# Patient Record
Sex: Male | Born: 1950
Health system: Southern US, Community
[De-identification: ages and names within clinical notes are randomized; demographics above are authoritative.]

## PROBLEM LIST (undated history)

## (undated) DIAGNOSIS — H43819 Vitreous degeneration, unspecified eye: Secondary | ICD-10-CM

## (undated) DIAGNOSIS — I10 Essential (primary) hypertension: Secondary | ICD-10-CM

## (undated) DIAGNOSIS — T1500XA Foreign body in cornea, unspecified eye, initial encounter: Secondary | ICD-10-CM

## (undated) DIAGNOSIS — M25562 Pain in left knee: Secondary | ICD-10-CM

## (undated) DIAGNOSIS — R0600 Dyspnea, unspecified: Secondary | ICD-10-CM

## (undated) DIAGNOSIS — J309 Allergic rhinitis, unspecified: Secondary | ICD-10-CM

## (undated) DIAGNOSIS — F909 Attention-deficit hyperactivity disorder, unspecified type: Secondary | ICD-10-CM

## (undated) DIAGNOSIS — Z6830 Body mass index (BMI) 30.0-30.9, adult: Secondary | ICD-10-CM

## (undated) DIAGNOSIS — N183 Chronic kidney disease, stage 3 unspecified: Secondary | ICD-10-CM

## (undated) DIAGNOSIS — N400 Enlarged prostate without lower urinary tract symptoms: Secondary | ICD-10-CM

## (undated) DIAGNOSIS — N529 Male erectile dysfunction, unspecified: Secondary | ICD-10-CM

## (undated) DIAGNOSIS — Q249 Congenital malformation of heart, unspecified: Secondary | ICD-10-CM

## (undated) DIAGNOSIS — R609 Edema, unspecified: Secondary | ICD-10-CM

## (undated) DIAGNOSIS — I509 Heart failure, unspecified: Secondary | ICD-10-CM

## (undated) DIAGNOSIS — I2510 Atherosclerotic heart disease of native coronary artery without angina pectoris: Secondary | ICD-10-CM

## (undated) DIAGNOSIS — E785 Hyperlipidemia, unspecified: Secondary | ICD-10-CM

## (undated) DIAGNOSIS — C61 Malignant neoplasm of prostate: Secondary | ICD-10-CM

## (undated) DIAGNOSIS — F419 Anxiety disorder, unspecified: Secondary | ICD-10-CM

## (undated) DIAGNOSIS — M1990 Unspecified osteoarthritis, unspecified site: Secondary | ICD-10-CM

## (undated) DIAGNOSIS — K219 Gastro-esophageal reflux disease without esophagitis: Secondary | ICD-10-CM

## (undated) HISTORY — DX: Male erectile dysfunction, unspecified: N52.9

## (undated) HISTORY — DX: Allergic rhinitis, unspecified: J30.9

## (undated) HISTORY — DX: Pain in left knee: M25.562

## (undated) HISTORY — DX: Foreign body in cornea, unspecified eye, initial encounter: T15.00XA

## (undated) HISTORY — PX: TONSILLECTOMY: SUR1361

## (undated) HISTORY — DX: Body mass index (BMI) 30.0-30.9, adult: Z68.30

## (undated) HISTORY — PX: CARDIAC CATHETERIZATION: SHX172

## (undated) HISTORY — DX: Benign prostatic hyperplasia without lower urinary tract symptoms: N40.0

## (undated) HISTORY — DX: Congenital malformation of heart, unspecified: Q24.9

## (undated) HISTORY — DX: Anxiety disorder, unspecified: F41.9

## (undated) HISTORY — DX: Hyperlipidemia, unspecified: E78.5

## (undated) HISTORY — DX: Vitreous degeneration, unspecified eye: H43.819

## (undated) HISTORY — DX: Attention-deficit hyperactivity disorder, unspecified type: F90.9

## (undated) HISTORY — DX: Malignant neoplasm of prostate: C61

## (undated) HISTORY — DX: Essential (primary) hypertension: I10

## (undated) HISTORY — DX: Chronic kidney disease, stage 3 unspecified: N18.30

## (undated) HISTORY — DX: Edema, unspecified: R60.9

---

## 2000-01-17 ENCOUNTER — Ambulatory Visit (HOSPITAL_COMMUNITY): Admission: RE | Admit: 2000-01-17 | Discharge: 2000-01-17 | Payer: Self-pay | Admitting: Family Medicine

## 2000-01-17 ENCOUNTER — Encounter: Payer: Self-pay | Admitting: Family Medicine

## 2013-06-27 ENCOUNTER — Other Ambulatory Visit: Payer: Self-pay | Admitting: Family Medicine

## 2013-06-27 ENCOUNTER — Ambulatory Visit
Admission: RE | Admit: 2013-06-27 | Discharge: 2013-06-27 | Disposition: A | Discharge status: Home / Self Care | Payer: 59 | Source: Ambulatory Visit | Attending: Family Medicine | Admitting: Family Medicine

## 2013-06-27 DIAGNOSIS — M79609 Pain in unspecified limb: Secondary | ICD-10-CM

## 2014-08-04 ENCOUNTER — Other Ambulatory Visit: Payer: Self-pay | Admitting: Family Medicine

## 2014-08-04 ENCOUNTER — Ambulatory Visit
Admission: RE | Admit: 2014-08-04 | Discharge: 2014-08-04 | Disposition: A | Discharge status: Home / Self Care | Payer: BLUE CROSS/BLUE SHIELD | Source: Ambulatory Visit | Attending: Family Medicine | Admitting: Family Medicine

## 2014-08-04 DIAGNOSIS — J209 Acute bronchitis, unspecified: Secondary | ICD-10-CM

## 2016-12-29 ENCOUNTER — Other Ambulatory Visit: Payer: Self-pay | Admitting: Gastroenterology

## 2016-12-29 DIAGNOSIS — R195 Other fecal abnormalities: Secondary | ICD-10-CM

## 2017-01-05 ENCOUNTER — Other Ambulatory Visit: Payer: BLUE CROSS/BLUE SHIELD

## 2017-01-08 ENCOUNTER — Ambulatory Visit
Admission: RE | Admit: 2017-01-08 | Discharge: 2017-01-08 | Disposition: A | Discharge status: Home / Self Care | Payer: BLUE CROSS/BLUE SHIELD | Source: Ambulatory Visit | Attending: Gastroenterology | Admitting: Gastroenterology

## 2017-01-08 DIAGNOSIS — R195 Other fecal abnormalities: Secondary | ICD-10-CM

## 2018-03-30 DIAGNOSIS — Z23 Encounter for immunization: Secondary | ICD-10-CM | POA: Diagnosis not present

## 2018-05-30 DIAGNOSIS — M25562 Pain in left knee: Secondary | ICD-10-CM | POA: Diagnosis not present

## 2018-06-03 DIAGNOSIS — M25562 Pain in left knee: Secondary | ICD-10-CM | POA: Diagnosis not present

## 2018-06-05 DIAGNOSIS — M25562 Pain in left knee: Secondary | ICD-10-CM | POA: Diagnosis not present

## 2018-06-10 DIAGNOSIS — M25562 Pain in left knee: Secondary | ICD-10-CM | POA: Diagnosis not present

## 2018-06-11 DIAGNOSIS — J3089 Other allergic rhinitis: Secondary | ICD-10-CM | POA: Diagnosis not present

## 2018-06-11 DIAGNOSIS — R030 Elevated blood-pressure reading, without diagnosis of hypertension: Secondary | ICD-10-CM | POA: Diagnosis not present

## 2018-06-11 DIAGNOSIS — R609 Edema, unspecified: Secondary | ICD-10-CM | POA: Diagnosis not present

## 2018-06-12 DIAGNOSIS — M25562 Pain in left knee: Secondary | ICD-10-CM | POA: Diagnosis not present

## 2018-06-17 DIAGNOSIS — M25562 Pain in left knee: Secondary | ICD-10-CM | POA: Diagnosis not present

## 2018-06-19 DIAGNOSIS — M25562 Pain in left knee: Secondary | ICD-10-CM | POA: Diagnosis not present

## 2018-06-21 DIAGNOSIS — J3089 Other allergic rhinitis: Secondary | ICD-10-CM | POA: Diagnosis not present

## 2018-06-21 DIAGNOSIS — J309 Allergic rhinitis, unspecified: Secondary | ICD-10-CM | POA: Diagnosis not present

## 2018-06-24 DIAGNOSIS — M25562 Pain in left knee: Secondary | ICD-10-CM | POA: Diagnosis not present

## 2018-06-26 DIAGNOSIS — M25562 Pain in left knee: Secondary | ICD-10-CM | POA: Diagnosis not present

## 2018-07-31 DIAGNOSIS — J342 Deviated nasal septum: Secondary | ICD-10-CM | POA: Diagnosis not present

## 2018-07-31 DIAGNOSIS — J310 Chronic rhinitis: Secondary | ICD-10-CM | POA: Diagnosis not present

## 2018-07-31 DIAGNOSIS — J343 Hypertrophy of nasal turbinates: Secondary | ICD-10-CM | POA: Diagnosis not present

## 2018-09-27 DIAGNOSIS — J3089 Other allergic rhinitis: Secondary | ICD-10-CM | POA: Diagnosis not present

## 2018-12-06 DIAGNOSIS — N400 Enlarged prostate without lower urinary tract symptoms: Secondary | ICD-10-CM | POA: Diagnosis not present

## 2018-12-06 DIAGNOSIS — Z0000 Encounter for general adult medical examination without abnormal findings: Secondary | ICD-10-CM | POA: Diagnosis not present

## 2018-12-06 DIAGNOSIS — J309 Allergic rhinitis, unspecified: Secondary | ICD-10-CM | POA: Diagnosis not present

## 2018-12-06 DIAGNOSIS — Z23 Encounter for immunization: Secondary | ICD-10-CM | POA: Diagnosis not present

## 2018-12-06 DIAGNOSIS — Z1389 Encounter for screening for other disorder: Secondary | ICD-10-CM | POA: Diagnosis not present

## 2018-12-06 DIAGNOSIS — Z6830 Body mass index (BMI) 30.0-30.9, adult: Secondary | ICD-10-CM | POA: Diagnosis not present

## 2018-12-06 DIAGNOSIS — Z125 Encounter for screening for malignant neoplasm of prostate: Secondary | ICD-10-CM | POA: Diagnosis not present

## 2018-12-06 DIAGNOSIS — E782 Mixed hyperlipidemia: Secondary | ICD-10-CM | POA: Diagnosis not present

## 2018-12-06 DIAGNOSIS — N529 Male erectile dysfunction, unspecified: Secondary | ICD-10-CM | POA: Diagnosis not present

## 2018-12-06 DIAGNOSIS — F411 Generalized anxiety disorder: Secondary | ICD-10-CM | POA: Diagnosis not present

## 2018-12-06 DIAGNOSIS — N183 Chronic kidney disease, stage 3 (moderate): Secondary | ICD-10-CM | POA: Diagnosis not present

## 2019-03-18 DIAGNOSIS — Z23 Encounter for immunization: Secondary | ICD-10-CM | POA: Diagnosis not present

## 2019-04-07 DIAGNOSIS — J3089 Other allergic rhinitis: Secondary | ICD-10-CM | POA: Diagnosis not present

## 2019-05-12 ENCOUNTER — Ambulatory Visit: Payer: Medicare Other | Attending: Internal Medicine

## 2019-05-12 DIAGNOSIS — Z20822 Contact with and (suspected) exposure to covid-19: Secondary | ICD-10-CM

## 2019-05-13 LAB — NOVEL CORONAVIRUS, NAA: SARS-CoV-2, NAA: NOT DETECTED

## 2019-07-12 ENCOUNTER — Ambulatory Visit: Payer: Medicare Other | Attending: Internal Medicine

## 2019-07-12 DIAGNOSIS — Z23 Encounter for immunization: Secondary | ICD-10-CM | POA: Insufficient documentation

## 2019-07-12 NOTE — Progress Notes (Signed)
   Covid-19 Vaccination Clinic  Name:  Glenn Murray    MRN: [id number] DOB: [birth year]  07/12/2019  [name] was observed post Covid-19 immunization for 15 minutes without incidence. He was provided with Vaccine Information Sheet and instruction to access the V-Safe system.   [name] was instructed to call 911 with any severe reactions post vaccine: Marland Kitchen Difficulty breathing  . Swelling of your face and throat  . A fast heartbeat  . A bad rash all over your body  . Dizziness and weakness    Immunizations Administered    Name Date Dose VIS Date Route   Pfizer COVID-19 Vaccine 07/12/2019  9:03 AM 0.3 mL 05/02/2019 Intramuscular   Manufacturer: [place]   Lot: [id number]   [place]: [id number]

## 2019-07-18 DIAGNOSIS — Z03818 Encounter for observation for suspected exposure to other biological agents ruled out: Secondary | ICD-10-CM | POA: Diagnosis not present

## 2019-07-18 DIAGNOSIS — Z20822 Contact with and (suspected) exposure to covid-19: Secondary | ICD-10-CM | POA: Diagnosis not present

## 2019-07-21 DIAGNOSIS — Z20822 Contact with and (suspected) exposure to covid-19: Secondary | ICD-10-CM | POA: Diagnosis not present

## 2019-08-05 ENCOUNTER — Ambulatory Visit: Payer: Medicare Other | Attending: Internal Medicine

## 2019-08-05 DIAGNOSIS — Z23 Encounter for immunization: Secondary | ICD-10-CM

## 2019-08-05 NOTE — Progress Notes (Signed)
   Covid-19 Vaccination Clinic  Name:  Glenn Murray    MRN: [id number] DOB: 27-Aug-1950  08/05/2019  Mr. Zaugg was observed post Covid-19 immunization for 15 minutes without incident. He was provided with Vaccine Information Sheet and instruction to access the V-Safe system.   Mr. Lurry was instructed to call 911 with any severe reactions post vaccine: Marland Kitchen Difficulty breathing  . Swelling of face and throat  . A fast heartbeat  . A bad rash all over body  . Dizziness and weakness   Immunizations Administered    Name Date Dose VIS Date Route   Pfizer COVID-19 Vaccine 08/05/2019  9:32 AM 0.3 mL 05/02/2019 Intramuscular   Manufacturer: [place]   Lot: [id number]   [place]: [id number]

## 2019-09-26 DIAGNOSIS — T1501XA Foreign body in cornea, right eye, initial encounter: Secondary | ICD-10-CM | POA: Diagnosis not present

## 2019-10-17 DIAGNOSIS — H43813 Vitreous degeneration, bilateral: Secondary | ICD-10-CM | POA: Diagnosis not present

## 2019-10-31 DIAGNOSIS — M25562 Pain in left knee: Secondary | ICD-10-CM | POA: Diagnosis not present

## 2019-12-12 DIAGNOSIS — M1712 Unilateral primary osteoarthritis, left knee: Secondary | ICD-10-CM | POA: Diagnosis not present

## 2019-12-12 DIAGNOSIS — M25562 Pain in left knee: Secondary | ICD-10-CM | POA: Diagnosis not present

## 2019-12-15 DIAGNOSIS — N183 Chronic kidney disease, stage 3 unspecified: Secondary | ICD-10-CM | POA: Diagnosis not present

## 2019-12-15 DIAGNOSIS — M25562 Pain in left knee: Secondary | ICD-10-CM | POA: Diagnosis not present

## 2019-12-15 DIAGNOSIS — N529 Male erectile dysfunction, unspecified: Secondary | ICD-10-CM | POA: Diagnosis not present

## 2019-12-15 DIAGNOSIS — Z125 Encounter for screening for malignant neoplasm of prostate: Secondary | ICD-10-CM | POA: Diagnosis not present

## 2019-12-15 DIAGNOSIS — N400 Enlarged prostate without lower urinary tract symptoms: Secondary | ICD-10-CM | POA: Diagnosis not present

## 2019-12-15 DIAGNOSIS — J309 Allergic rhinitis, unspecified: Secondary | ICD-10-CM | POA: Diagnosis not present

## 2019-12-15 DIAGNOSIS — Z23 Encounter for immunization: Secondary | ICD-10-CM | POA: Diagnosis not present

## 2019-12-15 DIAGNOSIS — E782 Mixed hyperlipidemia: Secondary | ICD-10-CM | POA: Diagnosis not present

## 2019-12-15 DIAGNOSIS — Z0000 Encounter for general adult medical examination without abnormal findings: Secondary | ICD-10-CM | POA: Diagnosis not present

## 2019-12-15 DIAGNOSIS — F411 Generalized anxiety disorder: Secondary | ICD-10-CM | POA: Diagnosis not present

## 2019-12-26 DIAGNOSIS — M25562 Pain in left knee: Secondary | ICD-10-CM | POA: Diagnosis not present

## 2020-01-09 DIAGNOSIS — S83241D Other tear of medial meniscus, current injury, right knee, subsequent encounter: Secondary | ICD-10-CM | POA: Diagnosis not present

## 2020-01-09 DIAGNOSIS — M1712 Unilateral primary osteoarthritis, left knee: Secondary | ICD-10-CM | POA: Diagnosis not present

## 2020-01-09 DIAGNOSIS — M25562 Pain in left knee: Secondary | ICD-10-CM | POA: Diagnosis not present

## 2020-03-02 DIAGNOSIS — M5136 Other intervertebral disc degeneration, lumbar region: Secondary | ICD-10-CM | POA: Diagnosis not present

## 2020-03-02 DIAGNOSIS — M9903 Segmental and somatic dysfunction of lumbar region: Secondary | ICD-10-CM | POA: Diagnosis not present

## 2020-03-04 DIAGNOSIS — M5136 Other intervertebral disc degeneration, lumbar region: Secondary | ICD-10-CM | POA: Diagnosis not present

## 2020-03-04 DIAGNOSIS — M9903 Segmental and somatic dysfunction of lumbar region: Secondary | ICD-10-CM | POA: Diagnosis not present

## 2020-03-10 DIAGNOSIS — M9903 Segmental and somatic dysfunction of lumbar region: Secondary | ICD-10-CM | POA: Diagnosis not present

## 2020-03-10 DIAGNOSIS — M5136 Other intervertebral disc degeneration, lumbar region: Secondary | ICD-10-CM | POA: Diagnosis not present

## 2020-03-16 DIAGNOSIS — M9903 Segmental and somatic dysfunction of lumbar region: Secondary | ICD-10-CM | POA: Diagnosis not present

## 2020-03-16 DIAGNOSIS — M5136 Other intervertebral disc degeneration, lumbar region: Secondary | ICD-10-CM | POA: Diagnosis not present

## 2020-03-18 DIAGNOSIS — M5136 Other intervertebral disc degeneration, lumbar region: Secondary | ICD-10-CM | POA: Diagnosis not present

## 2020-03-18 DIAGNOSIS — M9903 Segmental and somatic dysfunction of lumbar region: Secondary | ICD-10-CM | POA: Diagnosis not present

## 2020-03-20 ENCOUNTER — Ambulatory Visit: Payer: Medicare Other | Attending: Internal Medicine

## 2020-03-20 DIAGNOSIS — Z23 Encounter for immunization: Secondary | ICD-10-CM

## 2020-03-20 NOTE — Progress Notes (Signed)
   Covid-19 Vaccination Clinic  Name:  Glenn Murray    MRN: [id number] DOB: [birth year]  03/20/2020  [name] was observed post Covid-19 immunization for 15 minutes without incident. He was provided with Vaccine Information Sheet and instruction to access the V-Safe system.   [name] was instructed to call 911 with any severe reactions post vaccine: Marland Kitchen Difficulty breathing  . Swelling of face and throat  . A fast heartbeat  . A bad rash all over body  . Dizziness and weakness

## 2020-03-22 DIAGNOSIS — M9903 Segmental and somatic dysfunction of lumbar region: Secondary | ICD-10-CM | POA: Diagnosis not present

## 2020-03-22 DIAGNOSIS — M5136 Other intervertebral disc degeneration, lumbar region: Secondary | ICD-10-CM | POA: Diagnosis not present

## 2020-03-25 DIAGNOSIS — M5136 Other intervertebral disc degeneration, lumbar region: Secondary | ICD-10-CM | POA: Diagnosis not present

## 2020-03-25 DIAGNOSIS — M9903 Segmental and somatic dysfunction of lumbar region: Secondary | ICD-10-CM | POA: Diagnosis not present

## 2020-03-29 DIAGNOSIS — J3089 Other allergic rhinitis: Secondary | ICD-10-CM | POA: Diagnosis not present

## 2020-03-30 DIAGNOSIS — M5136 Other intervertebral disc degeneration, lumbar region: Secondary | ICD-10-CM | POA: Diagnosis not present

## 2020-03-30 DIAGNOSIS — M9903 Segmental and somatic dysfunction of lumbar region: Secondary | ICD-10-CM | POA: Diagnosis not present

## 2020-04-01 DIAGNOSIS — M5136 Other intervertebral disc degeneration, lumbar region: Secondary | ICD-10-CM | POA: Diagnosis not present

## 2020-04-01 DIAGNOSIS — M9903 Segmental and somatic dysfunction of lumbar region: Secondary | ICD-10-CM | POA: Diagnosis not present

## 2020-04-06 DIAGNOSIS — M5136 Other intervertebral disc degeneration, lumbar region: Secondary | ICD-10-CM | POA: Diagnosis not present

## 2020-04-06 DIAGNOSIS — M9903 Segmental and somatic dysfunction of lumbar region: Secondary | ICD-10-CM | POA: Diagnosis not present

## 2020-04-13 DIAGNOSIS — M9903 Segmental and somatic dysfunction of lumbar region: Secondary | ICD-10-CM | POA: Diagnosis not present

## 2020-04-13 DIAGNOSIS — M5136 Other intervertebral disc degeneration, lumbar region: Secondary | ICD-10-CM | POA: Diagnosis not present

## 2020-04-20 DIAGNOSIS — M9903 Segmental and somatic dysfunction of lumbar region: Secondary | ICD-10-CM | POA: Diagnosis not present

## 2020-04-20 DIAGNOSIS — M5136 Other intervertebral disc degeneration, lumbar region: Secondary | ICD-10-CM | POA: Diagnosis not present

## 2020-04-23 DIAGNOSIS — M1712 Unilateral primary osteoarthritis, left knee: Secondary | ICD-10-CM | POA: Diagnosis not present

## 2020-04-27 DIAGNOSIS — M5136 Other intervertebral disc degeneration, lumbar region: Secondary | ICD-10-CM | POA: Diagnosis not present

## 2020-04-27 DIAGNOSIS — M9903 Segmental and somatic dysfunction of lumbar region: Secondary | ICD-10-CM | POA: Diagnosis not present

## 2020-04-30 DIAGNOSIS — M1712 Unilateral primary osteoarthritis, left knee: Secondary | ICD-10-CM | POA: Diagnosis not present

## 2020-04-30 DIAGNOSIS — M25562 Pain in left knee: Secondary | ICD-10-CM | POA: Diagnosis not present

## 2020-05-10 DIAGNOSIS — M1712 Unilateral primary osteoarthritis, left knee: Secondary | ICD-10-CM | POA: Diagnosis not present

## 2020-05-11 DIAGNOSIS — M5136 Other intervertebral disc degeneration, lumbar region: Secondary | ICD-10-CM | POA: Diagnosis not present

## 2020-05-11 DIAGNOSIS — M9903 Segmental and somatic dysfunction of lumbar region: Secondary | ICD-10-CM | POA: Diagnosis not present

## 2020-06-15 DIAGNOSIS — Z20822 Contact with and (suspected) exposure to covid-19: Secondary | ICD-10-CM | POA: Diagnosis not present

## 2020-06-18 DIAGNOSIS — M1712 Unilateral primary osteoarthritis, left knee: Secondary | ICD-10-CM | POA: Diagnosis not present

## 2020-06-24 DIAGNOSIS — E7800 Pure hypercholesterolemia, unspecified: Secondary | ICD-10-CM | POA: Diagnosis not present

## 2020-06-24 DIAGNOSIS — E785 Hyperlipidemia, unspecified: Secondary | ICD-10-CM | POA: Diagnosis not present

## 2020-06-24 DIAGNOSIS — R531 Weakness: Secondary | ICD-10-CM | POA: Diagnosis not present

## 2020-06-24 DIAGNOSIS — T751XXA Unspecified effects of drowning and nonfatal submersion, initial encounter: Secondary | ICD-10-CM | POA: Diagnosis not present

## 2020-06-24 DIAGNOSIS — E861 Hypovolemia: Secondary | ICD-10-CM | POA: Diagnosis not present

## 2020-06-24 DIAGNOSIS — J9601 Acute respiratory failure with hypoxia: Secondary | ICD-10-CM | POA: Diagnosis not present

## 2020-06-24 DIAGNOSIS — R0682 Tachypnea, not elsewhere classified: Secondary | ICD-10-CM | POA: Diagnosis not present

## 2020-06-24 DIAGNOSIS — N289 Disorder of kidney and ureter, unspecified: Secondary | ICD-10-CM | POA: Diagnosis not present

## 2020-06-24 DIAGNOSIS — R0602 Shortness of breath: Secondary | ICD-10-CM | POA: Diagnosis not present

## 2020-06-24 DIAGNOSIS — F419 Anxiety disorder, unspecified: Secondary | ICD-10-CM | POA: Diagnosis not present

## 2020-06-24 DIAGNOSIS — Z20822 Contact with and (suspected) exposure to covid-19: Secondary | ICD-10-CM | POA: Diagnosis not present

## 2020-06-24 DIAGNOSIS — Y9315 Activity, underwater diving and snorkeling: Secondary | ICD-10-CM | POA: Diagnosis not present

## 2020-06-24 DIAGNOSIS — Y92832 Beach as the place of occurrence of the external cause: Secondary | ICD-10-CM | POA: Diagnosis not present

## 2020-06-24 DIAGNOSIS — Z7982 Long term (current) use of aspirin: Secondary | ICD-10-CM | POA: Diagnosis not present

## 2020-06-24 DIAGNOSIS — R079 Chest pain, unspecified: Secondary | ICD-10-CM | POA: Diagnosis not present

## 2020-06-24 DIAGNOSIS — E872 Acidosis: Secondary | ICD-10-CM | POA: Diagnosis not present

## 2020-06-24 DIAGNOSIS — I2510 Atherosclerotic heart disease of native coronary artery without angina pectoris: Secondary | ICD-10-CM | POA: Diagnosis not present

## 2020-06-29 DIAGNOSIS — G4734 Idiopathic sleep related nonobstructive alveolar hypoventilation: Secondary | ICD-10-CM | POA: Diagnosis not present

## 2020-06-29 DIAGNOSIS — Q249 Congenital malformation of heart, unspecified: Secondary | ICD-10-CM | POA: Diagnosis not present

## 2020-06-29 DIAGNOSIS — T17908A Unspecified foreign body in respiratory tract, part unspecified causing other injury, initial encounter: Secondary | ICD-10-CM | POA: Diagnosis not present

## 2020-06-29 DIAGNOSIS — T751XXA Unspecified effects of drowning and nonfatal submersion, initial encounter: Secondary | ICD-10-CM | POA: Diagnosis not present

## 2020-06-30 ENCOUNTER — Telehealth: Payer: Self-pay | Admitting: Cardiology

## 2020-06-30 ENCOUNTER — Ambulatory Visit
Admission: RE | Admit: 2020-06-30 | Discharge: 2020-06-30 | Disposition: A | Discharge status: Home / Self Care | Payer: Medicare Other | Source: Ambulatory Visit | Attending: Cardiology | Admitting: Cardiology

## 2020-06-30 ENCOUNTER — Ambulatory Visit (INDEPENDENT_AMBULATORY_CARE_PROVIDER_SITE_OTHER): Payer: Medicare Other | Admitting: Cardiology

## 2020-06-30 ENCOUNTER — Other Ambulatory Visit: Payer: Self-pay

## 2020-06-30 VITALS — BP 120/72 | HR 77 | Ht 69.5 in | Wt 220.0 lb

## 2020-06-30 DIAGNOSIS — R9431 Abnormal electrocardiogram [ECG] [EKG]: Secondary | ICD-10-CM | POA: Diagnosis not present

## 2020-06-30 DIAGNOSIS — I5021 Acute systolic (congestive) heart failure: Secondary | ICD-10-CM | POA: Diagnosis not present

## 2020-06-30 DIAGNOSIS — I493 Ventricular premature depolarization: Secondary | ICD-10-CM | POA: Diagnosis not present

## 2020-06-30 DIAGNOSIS — R0602 Shortness of breath: Secondary | ICD-10-CM

## 2020-06-30 DIAGNOSIS — R0600 Dyspnea, unspecified: Secondary | ICD-10-CM | POA: Diagnosis not present

## 2020-06-30 DIAGNOSIS — I447 Left bundle-branch block, unspecified: Secondary | ICD-10-CM | POA: Diagnosis not present

## 2020-06-30 DIAGNOSIS — R0609 Other forms of dyspnea: Secondary | ICD-10-CM

## 2020-06-30 MED ORDER — ROSUVASTATIN CALCIUM 20 MG PO TABS
20.0000 mg | ORAL_TABLET | Freq: Every day | ORAL | 3 refills | Status: DC
Start: 2020-06-30 — End: 2021-01-14

## 2020-06-30 MED ORDER — METOPROLOL SUCCINATE ER 25 MG PO TB24: 25.0000 mg | ORAL_TABLET | Freq: Every day | ORAL | 3 refills | Status: DC

## 2020-06-30 MED ORDER — ASPIRIN EC 81 MG PO TBEC: 81.0000 mg | DELAYED_RELEASE_TABLET | Freq: Every day | ORAL | 3 refills | Status: DC

## 2020-06-30 MED ORDER — FUROSEMIDE 20 MG PO TABS: 20.0000 mg | ORAL_TABLET | Freq: Two times a day (BID) | ORAL | 3 refills | Status: DC
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
## 2020-06-30 NOTE — Telephone Encounter (Signed)
Spoke with pt and advised per Dr Johney Frame continue Doxycycline uninterrupted prior to cath as it will not interfere with anything they plan to do.  Pt verbalizes understanding and thanked Therapist, sports for call.

## 2020-06-30 NOTE — Telephone Encounter (Signed)
He needs to continue it uninterrupted prior to cath. It will not interfere with anything they plan to do, but he needs to stay on it.

## 2020-06-30 NOTE — Patient Instructions (Addendum)
Medication Instructions:  Your physician has recommended you make the following change in your medication:   START: 1.Aspirin 81mg  daily 2.Furosemide 20mg  (Lasix) two times a day 3. Metoprolol xl 25mg  (Toprol) daily   INCREASE: 1. Crestor to 20mg  daily.   *If you need a refill on your cardiac medications before your next appointment, please call your pharmacy*   Lab Work: TODAY:BMET, BNP, CBC If you have labs (blood work) drawn today and your tests are completely normal, you will receive your results only by: Marland Kitchen MyChart Message (if you have MyChart) OR . A paper copy in the mail If you have any lab test that is abnormal or we need to change your treatment, we will call you to review the results.   Testing/Procedures: Your physician has requested that you have an echocardiogram. Echocardiography is a painless test that uses sound waves to create images of your heart. It provides your doctor with information about the size and shape of your heart and how well your heart's chambers and valves are working. This procedure takes approximately one hour. There are no restrictions for this procedure.  A chest x-ray takes a picture of the organs and structures inside the chest, including the heart, lungs, and blood vessels. This test can show several things, including, whether the heart is enlarges; whether fluid is building up in the lungs; and whether pacemaker / defibrillator leads are still in place.    Follow-Up: At [facility], you and your health needs are our priority.  As part of our continuing mission to provide you with exceptional heart care, we have created designated Provider Care Teams.  These Care Teams include your primary Cardiologist (physician) and Advanced Practice Providers (APPs -  Physician Assistants and Nurse Practitioners) who all work together to provide you with the care you need, when you need it.  We recommend signing up for the patient portal called "MyChart".   Sign up information is provided on this After Visit Summary.  MyChart is used to connect with patients for Virtual Visits (Telemedicine).  Patients are able to view lab/test results, encounter notes, upcoming appointments, etc.  Non-urgent messages can be sent to your provider as well.   To learn more about what you can do with MyChart, go to [email or web address].    Your next appointment:  08/05/2020 at 10:20am 1 month(s)  The format for your next appointment:   In Person  Provider:   You may see Dr. Ernest Mallick or one of the following Advanced Practice Providers on your designated Care Team:    Richardson Dopp, PA-C  [place], [place]

## 2020-06-30 NOTE — Telephone Encounter (Signed)
Patient is calling to let Dr. Johney Frame know about the antibiotic named doxycycline 100 mg tablet for the next 10 days for possible lung infection. Wants to know if he should continue to take it due to the fact that he's having a catherization coming up. Please call
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
## 2020-07-01 ENCOUNTER — Telehealth: Payer: Self-pay

## 2020-07-01 LAB — CBC
Hematocrit: 44.5 % (ref 37.5–51.0)
Hemoglobin: 15.3 g/dL (ref 13.0–17.7)
MCH: 31.5 pg (ref 26.6–33.0)
MCHC: 34.4 g/dL (ref 31.5–35.7)
MCV: 92 fL (ref 79–97)
Platelets: 201 10*3/uL (ref 150–450)
RBC: 4.86 x10E6/uL (ref 4.14–5.80)
RDW: 12.8 % (ref 11.6–15.4)
WBC: 5.8 10*3/uL (ref 3.4–10.8)

## 2020-07-01 LAB — BASIC METABOLIC PANEL
BUN/Creatinine Ratio: 20 (ref 10–24)
BUN: 19 mg/dL (ref 8–27)
CO2: 24 mmol/L (ref 20–29)
Calcium: 9.5 mg/dL (ref 8.6–10.2)
Chloride: 102 mmol/L (ref 96–106)
Creatinine, Ser: 0.97 mg/dL (ref 0.76–1.27)
GFR calc Af Amer: 93 mL/min/{1.73_m2} (ref >59)
GFR calc non Af Amer: 80 mL/min/{1.73_m2} (ref >59)
Glucose: 89 mg/dL (ref 65–99)
Potassium: 4.4 mmol/L (ref 3.5–5.2)
Sodium: 141 mmol/L (ref 134–144)

## 2020-07-01 LAB — PRO B NATRIURETIC PEPTIDE: NT-Pro BNP: 51 pg/mL (ref 0–376)

## 2020-07-01 NOTE — Telephone Encounter (Signed)
-----   Message from Freada Bergeron, MD sent at 07/01/2020  8:46 AM EST ----- His chest xray looks good without evidence of active infection or fluid. Given his shortness of breath when laying flat, he should take the lasix 20mg  twice a day for 3 days and then hold it and see how he feels. We can then adjust the dosage if needed after his catheterization. He should continue the antibiotic, doxycycline without interruption until he completes the course. It will not interfere with anything with the catheterization.

## 2020-07-01 NOTE — Telephone Encounter (Signed)
Rn spoke with patient and patients wife regarding CXR and labs from 06/30/20. RN reviewed plan from Dr. Johney Frame to take lasix 20mg  BID for 3 days and then hold. Patient scheduled for cath on 2/16. Patient in agreement. Patient states they received the oxygen late last night, and he was able to sleep more sound through the night. No further concerns at this time. RN encouraged patient to contact the office with any changes or concerns.

## 2020-07-05 ENCOUNTER — Encounter: Payer: Self-pay | Admitting: Internal Medicine

## 2020-07-05 ENCOUNTER — Other Ambulatory Visit (HOSPITAL_COMMUNITY)
Admission: RE | Admit: 2020-07-05 | Discharge: 2020-07-05 | Disposition: A | Discharge status: Home / Self Care | Payer: Medicare Other | Source: Ambulatory Visit | Attending: Cardiovascular Disease | Admitting: Cardiovascular Disease

## 2020-07-05 ENCOUNTER — Ambulatory Visit (INDEPENDENT_AMBULATORY_CARE_PROVIDER_SITE_OTHER): Payer: Medicare Other | Admitting: Internal Medicine

## 2020-07-05 ENCOUNTER — Other Ambulatory Visit: Payer: Self-pay

## 2020-07-05 DIAGNOSIS — Z20822 Contact with and (suspected) exposure to covid-19: Secondary | ICD-10-CM | POA: Insufficient documentation

## 2020-07-05 DIAGNOSIS — Z01812 Encounter for preprocedural laboratory examination: Secondary | ICD-10-CM | POA: Insufficient documentation

## 2020-07-05 DIAGNOSIS — R058 Other specified cough: Secondary | ICD-10-CM

## 2020-07-05 DIAGNOSIS — J9601 Acute respiratory failure with hypoxia: Secondary | ICD-10-CM | POA: Diagnosis not present

## 2020-07-05 LAB — SARS CORONAVIRUS 2 (TAT 6-24 HRS): SARS Coronavirus 2: NEGATIVE

## 2020-07-05 NOTE — Patient Instructions (Addendum)
To get the most out of exercise, you need to be continuously aware that you are short of breath, but never out of breath, for 30 minutes daily. As you improve, it will actually be easier for you to do the same amount of exercise  in  30 minutes so always push to the level where you are short of breath.    Make sure you check your oxygen saturation at your highest level of activity to be sure it stays over 90% and keep track of it at least once a week, more often if breathing getting worse, and let me know if losing ground.   Try prilosec otc 20mg  Take 30-60 min before first meal of the day and Pepcid ac (famotidine) 20 mg after supper  until cough is completely gone for at least a week without the need for cough suppression (delsym)   GERD (REFLUX)  is an extremely common cause of respiratory symptoms just like yours , many times with no obvious heartburn at all.    It can be treated with medication, but also with lifestyle changes including elevation of the head of your bed (ideally with 6 -8inch blocks under the headboard of your bed),  Smoking cessation, avoidance of late meals, excessive alcohol, and avoid fatty foods, chocolate, peppermint, colas, red wine, and acidic juices such as orange juice.  NO MINT OR MENTHOL PRODUCTS SO NO COUGH DROPS  USE SUGARLESS CANDY INSTEAD (Jolley ranchers or Stover's or Life Savers) or even ice chips will also do - the key is to swallow to prevent all throat clearing. NO OIL BASED VITAMINS - use powdered substitutes.  Avoid fish oil when coughing.    Please schedule a follow up office visit in 4 weeks, sooner if needed
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
## 2020-07-06 ENCOUNTER — Telehealth: Payer: Self-pay | Admitting: *Deleted

## 2020-07-06 NOTE — Telephone Encounter (Signed)
Pt contacted pre-catheterization scheduled at [facility] for: Wednesday July 07, 2020 8:30 AM Verified arrival time and place: [place] [facility]) at: 6:30 AM   No solid food after midnight prior to cath, clear liquids until 5 AM day of procedure.  Hold: Lasix-pt not taking Sildenafil-until post procedure  Except hold medications AM meds can be  taken pre-cath with sips of water including: ASA 81 mg   Confirmed patient has responsible adult to drive home post procedure and be with patient first 24 hours after arriving home: yes  You are allowed ONE visitor in the waiting room during the time you are at the hospital for your procedure. Both you and your visitor must wear a mask once you enter the hospital.  Reviewed procedure/mask/visitor instructions with patient.

## 2020-07-07 ENCOUNTER — Other Ambulatory Visit: Payer: Self-pay

## 2020-07-07 ENCOUNTER — Encounter (HOSPITAL_COMMUNITY)
Admission: RE | Disposition: A | Discharge status: Home / Self Care | Payer: Self-pay | Source: Home / Self Care | Attending: Cardiovascular Disease

## 2020-07-07 ENCOUNTER — Encounter (HOSPITAL_COMMUNITY): Payer: Self-pay | Admitting: Cardiovascular Disease

## 2020-07-07 ENCOUNTER — Ambulatory Visit (HOSPITAL_COMMUNITY)
Admission: RE | Admit: 2020-07-07 | Discharge: 2020-07-07 | Disposition: A | Discharge status: Home / Self Care | Payer: Medicare Other | Attending: Cardiovascular Disease | Admitting: Cardiovascular Disease

## 2020-07-07 DIAGNOSIS — I110 Hypertensive heart disease with heart failure: Secondary | ICD-10-CM | POA: Insufficient documentation

## 2020-07-07 DIAGNOSIS — Z7982 Long term (current) use of aspirin: Secondary | ICD-10-CM | POA: Diagnosis not present

## 2020-07-07 DIAGNOSIS — Z79899 Other long term (current) drug therapy: Secondary | ICD-10-CM | POA: Insufficient documentation

## 2020-07-07 DIAGNOSIS — I2582 Chronic total occlusion of coronary artery: Secondary | ICD-10-CM | POA: Diagnosis not present

## 2020-07-07 DIAGNOSIS — I5021 Acute systolic (congestive) heart failure: Secondary | ICD-10-CM | POA: Diagnosis not present

## 2020-07-07 DIAGNOSIS — R0609 Other forms of dyspnea: Secondary | ICD-10-CM | POA: Diagnosis present

## 2020-07-07 DIAGNOSIS — I2510 Atherosclerotic heart disease of native coronary artery without angina pectoris: Secondary | ICD-10-CM | POA: Diagnosis not present

## 2020-07-07 DIAGNOSIS — I447 Left bundle-branch block, unspecified: Secondary | ICD-10-CM | POA: Insufficient documentation

## 2020-07-07 HISTORY — PX: LEFT HEART CATH AND CORONARY ANGIOGRAPHY: CATH118249

## 2020-07-07 SURGERY — LEFT HEART CATH AND CORONARY ANGIOGRAPHY
Anesthesia: LOCAL

## 2020-07-07 MED ORDER — ONDANSETRON HCL 4 MG/2ML IJ SOLN: 4.0000 mg | Freq: Four times a day (QID) | INTRAMUSCULAR | Status: DC | PRN

## 2020-07-07 MED ORDER — SODIUM CHLORIDE 0.9 % IV SOLN: INTRAVENOUS | Status: DC

## 2020-07-07 MED ORDER — LIDOCAINE HCL (PF) 1 % IJ SOLN
INTRAMUSCULAR | Status: AC
  Filled 2020-07-07: qty 30

## 2020-07-07 MED ORDER — FENTANYL CITRATE (PF) 100 MCG/2ML IJ SOLN
INTRAMUSCULAR | Status: AC
  Filled 2020-07-07: qty 2

## 2020-07-07 MED ORDER — ASPIRIN 81 MG PO CHEW
81.0000 mg | CHEWABLE_TABLET | ORAL | Status: AC
  Administered 2020-07-07: 81 mg via ORAL
  Filled 2020-07-07: qty 1

## 2020-07-07 MED ORDER — VERAPAMIL HCL 2.5 MG/ML IV SOLN
INTRAVENOUS | Status: AC
  Filled 2020-07-07: qty 2

## 2020-07-07 MED ORDER — HEPARIN SODIUM (PORCINE) 1000 UNIT/ML IJ SOLN
INTRAMUSCULAR | Status: AC
  Filled 2020-07-07: qty 1

## 2020-07-07 MED ORDER — VERAPAMIL HCL 2.5 MG/ML IV SOLN
INTRAVENOUS | Status: DC | PRN
  Administered 2020-07-07: 10 mL via INTRA_ARTERIAL

## 2020-07-07 MED ORDER — SODIUM CHLORIDE 0.9% FLUSH: 3.0000 mL | Freq: Two times a day (BID) | INTRAVENOUS | Status: DC

## 2020-07-07 MED ORDER — SODIUM CHLORIDE 0.9% FLUSH: 3.0000 mL | INTRAVENOUS | Status: DC | PRN

## 2020-07-07 MED ORDER — MIDAZOLAM HCL 2 MG/2ML IJ SOLN
INTRAMUSCULAR | Status: DC | PRN
  Administered 2020-07-07: 2 mg via INTRAVENOUS

## 2020-07-07 MED ORDER — IOHEXOL 350 MG/ML SOLN
INTRAVENOUS | Status: DC | PRN
  Administered 2020-07-07: 65 mL

## 2020-07-07 MED ORDER — SODIUM CHLORIDE 0.9 % IV SOLN: INTRAVENOUS | Status: AC

## 2020-07-07 MED ORDER — HEPARIN (PORCINE) IN NACL 1000-0.9 UT/500ML-% IV SOLN
INTRAVENOUS | Status: DC | PRN
  Administered 2020-07-07 (×2): 500 mL

## 2020-07-07 MED ORDER — HYDRALAZINE HCL 20 MG/ML IJ SOLN: 10.0000 mg | INTRAMUSCULAR | Status: DC | PRN

## 2020-07-07 MED ORDER — SODIUM CHLORIDE 0.9 % IV SOLN: 250.0000 mL | INTRAVENOUS | Status: DC | PRN

## 2020-07-07 MED ORDER — FENTANYL CITRATE (PF) 100 MCG/2ML IJ SOLN
INTRAMUSCULAR | Status: DC | PRN
  Administered 2020-07-07: 50 ug via INTRAVENOUS

## 2020-07-07 MED ORDER — HEPARIN (PORCINE) IN NACL 1000-0.9 UT/500ML-% IV SOLN
INTRAVENOUS | Status: AC
  Filled 2020-07-07: qty 1000

## 2020-07-07 MED ORDER — MIDAZOLAM HCL 2 MG/2ML IJ SOLN
INTRAMUSCULAR | Status: AC
  Filled 2020-07-07: qty 2

## 2020-07-07 MED ORDER — ACETAMINOPHEN 325 MG PO TABS: 650.0000 mg | ORAL_TABLET | ORAL | Status: DC | PRN

## 2020-07-07 MED ORDER — LABETALOL HCL 5 MG/ML IV SOLN: 10.0000 mg | INTRAVENOUS | Status: DC | PRN

## 2020-07-07 MED ORDER — LIDOCAINE HCL (PF) 1 % IJ SOLN
INTRAMUSCULAR | Status: DC | PRN
  Administered 2020-07-07: 2 mL

## 2020-07-07 MED ORDER — HEPARIN SODIUM (PORCINE) 1000 UNIT/ML IJ SOLN
INTRAMUSCULAR | Status: DC | PRN
  Administered 2020-07-07: 5000 [IU] via INTRAVENOUS

## 2020-07-07 SURGICAL SUPPLY — 10 items
BAG SNAP BAND KOVER 36X36 (MISCELLANEOUS) ×2 IMPLANT
CATH 5FR JL3.5 JR4 ANG PIG MP (CATHETERS) ×1 IMPLANT
ELECT DEFIB PAD ADLT CADENCE (PAD) ×1 IMPLANT
GLIDESHEATH SLEND SS 6F .021 (SHEATH) ×1 IMPLANT
GUIDEWIRE INQWIRE 1.5J.035X260 (WIRE) IMPLANT
INQWIRE 1.5J .035X260CM (WIRE) ×2
KIT HEART LEFT (KITS) ×2 IMPLANT
PACK CARDIAC CATHETERIZATION (CUSTOM PROCEDURE TRAY) ×2 IMPLANT
TRANSDUCER W/STOPCOCK (MISCELLANEOUS) ×2 IMPLANT
TUBING CIL FLEX 10 FLL-RA (TUBING) ×2 IMPLANT

## 2020-07-07 NOTE — Progress Notes (Signed)
Discharge instructions reviewed with pt and his wife (via telephone) both voice understanding.  

## 2020-07-07 NOTE — Research (Signed)
IDENTIFY Informed Consent   Subject Name: Glenn Murray  Subject met inclusion and exclusion criteria.  The informed consent form, study requirements and expectations were reviewed with the subject and questions and concerns were addressed prior to the signing of the consent form.  The subject verbalized understanding of the trail requirements.  The subject agreed to participate in the IDENTIFY trial and signed the informed consent.  The informed consent was obtained prior to performance of any protocol-specific procedures for the subject.  A copy of the signed informed consent was given to the subject and a copy was placed in the subject's medical record.  McChesney, Marilyn K. 07/07/2020, 06:45 am   

## 2020-07-07 NOTE — Discharge Instructions (Signed)
Radial Site Care  This sheet gives you information about how to care for yourself after your procedure. Your health care provider may also give you more specific instructions. If you have problems or questions, contact your health care provider. What can I expect after the procedure? After the procedure, it is common to have:  Bruising and tenderness at the catheter insertion area. Follow these instructions at home: Medicines  Take over-the-counter and prescription medicines only as told by your health care provider. Insertion site care 1. Follow instructions from your health care provider about how to take care of your insertion site. Make sure you: ? Wash your hands with soap and water before you remove your bandage (dressing). If soap and water are not available, use hand sanitizer. ? May remove dressing in 24 hours. 2. Check your insertion site every day for signs of infection. Check for: ? Redness, swelling, or pain. ? Fluid or blood. ? Pus or a bad smell. ? Warmth. 3. Do no take baths, swim, or use a hot tub for 5 days. 4. You may shower 24-48 hours after the procedure. ? Remove the dressing and gently wash the site with plain soap and water. ? Pat the area dry with a clean towel. ? Do not rub the site. That could cause bleeding. 5. Do not apply powder or lotion to the site. Activity  1. For 24 hours after the procedure, or as directed by your health care provider: ? Do not flex or bend the affected arm. ? Do not push or pull heavy objects with the affected arm. ? Do not drive yourself home from the hospital or clinic. You may drive 24 hours after the procedure. ? Do not operate machinery or power tools. ? KEEP ARM ELEVATED THE REMAINDER OF THE DAY. 2. Do not push, pull or lift anything that is heavier than 10 lb for 5 days. 3. Ask your health care provider when it is okay to: ? Return to work or school. ? Resume usual physical activities or sports. ? Resume sexual  activity. General instructions  If the catheter site starts to bleed, raise your arm and put firm pressure on the site. If the bleeding does not stop, get help right away. This is a medical emergency.  DRINK PLENTY OF FLUIDS FOR THE NEXT 2-3 DAYS.  No alcohol consumption for 24 hours after receiving sedation.  If you went home on the same day as your procedure, a responsible adult should be with you for the first 24 hours after you arrive home.  Keep all follow-up visits as told by your health care provider. This is important. Contact a health care provider if:  You have a fever.  You have redness, swelling, or yellow drainage around your insertion site. Get help right away if:  You have unusual pain at the radial site.  The catheter insertion area swells very fast.  The insertion area is bleeding, and the bleeding does not stop when you hold steady pressure on the area.  Your arm or hand becomes pale, cool, tingly, or numb. These symptoms may represent a serious problem that is an emergency. Do not wait to see if the symptoms will go away. Get medical help right away. Call your local emergency services (911 in the [place] Do not drive yourself to the hospital. Summary  After the procedure, it is common to have bruising and tenderness at the site.  Follow instructions from your health care provider about how to take care   of your radial site wound. Check the wound every day for signs of infection.  This information is not intended to replace advice given to you by your health care provider. Make sure you discuss any questions you have with your health care provider. Document Revised: 06/13/2017 Document Reviewed: 06/13/2017 Elsevier Patient Education  2020 Elsevier Inc. 

## 2020-07-07 NOTE — Interval H&P Note (Signed)
History and Physical Interval Note:  07/07/2020 8:23 AM  Glenn Murray  has presented today for surgery, with the diagnosis of abnormal ekg, shortness of breath.  The various methods of treatment have been discussed with the patient and family. After consideration of risks, benefits and other options for treatment, the patient has consented to  Procedure(s): LEFT HEART CATH AND CORONARY ANGIOGRAPHY (N/A) as a surgical intervention.  The patient's history has been reviewed, patient examined, no change in status, stable for surgery.  I have reviewed the patient's chart and labs.  Questions were answered to the patient's satisfaction.    Cath Lab Visit (complete for each Cath Lab visit)  Clinical Evaluation Leading to the Procedure:   ACS: No.  Non-ACS:    Anginal Classification: CCS II  Anti-ischemic medical therapy: Minimal Therapy (1 class of medications)  Non-Invasive Test Results: No non-invasive testing performed  Prior CABG: No previous CABG        Glenn Murray

## 2020-07-07 NOTE — Progress Notes (Signed)
Arm board applied to right arm

## 2020-07-20 DIAGNOSIS — 419620001 Death: Secondary | SNOMED CT | POA: Diagnosis not present

## 2020-07-20 DEATH — deceased

## 2020-07-22 ENCOUNTER — Other Ambulatory Visit: Payer: Self-pay

## 2020-07-22 ENCOUNTER — Ambulatory Visit (HOSPITAL_COMMUNITY): Payer: Medicare Other | Attending: Cardiology

## 2020-07-22 DIAGNOSIS — R9431 Abnormal electrocardiogram [ECG] [EKG]: Secondary | ICD-10-CM | POA: Diagnosis not present

## 2020-07-22 DIAGNOSIS — R0602 Shortness of breath: Secondary | ICD-10-CM | POA: Diagnosis not present

## 2020-07-22 LAB — ECHOCARDIOGRAM COMPLETE: S' Lateral: 4 cm

## 2020-07-22 MED ORDER — PERFLUTREN LIPID MICROSPHERE
1.0000 mL | INTRAVENOUS | Status: AC | PRN
  Administered 2020-07-22: 1 mL via INTRAVENOUS

## 2020-07-26 DIAGNOSIS — J310 Chronic rhinitis: Secondary | ICD-10-CM | POA: Diagnosis not present

## 2020-07-26 DIAGNOSIS — J343 Hypertrophy of nasal turbinates: Secondary | ICD-10-CM | POA: Diagnosis not present

## 2020-07-26 DIAGNOSIS — J342 Deviated nasal septum: Secondary | ICD-10-CM | POA: Diagnosis not present

## 2020-08-02 ENCOUNTER — Other Ambulatory Visit: Payer: Self-pay

## 2020-08-02 ENCOUNTER — Ambulatory Visit (INDEPENDENT_AMBULATORY_CARE_PROVIDER_SITE_OTHER): Payer: Medicare Other | Admitting: Internal Medicine

## 2020-08-02 ENCOUNTER — Encounter: Payer: Self-pay | Admitting: Internal Medicine

## 2020-08-02 DIAGNOSIS — R058 Other specified cough: Secondary | ICD-10-CM

## 2020-08-02 DIAGNOSIS — J9601 Acute respiratory failure with hypoxia: Secondary | ICD-10-CM | POA: Diagnosis not present
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
## 2020-08-02 NOTE — Patient Instructions (Addendum)
Avoid throat clearing as much as possible  For drainage / throat tickle/congestion  try take CHLORPHENIRAMINE  4 mg  (Chlortab 4mg   at [employer] should be easiest to find in the green box)  take one every 4 hours as needed - available over the counter- may cause drowsiness so start with a dose or two one hour before bedtime and see how you tolerate it before trying in daytime   - could still use clariton daytime   We will call to set you up with a speech therapist.  Try prilosec otc 20mg   Take 30-60 min before first meal of the day and Pepcid ac (famotidine) 20 mg one @  bedtime until cough and throat clear are  completely gone for at least a week without the need for cough suppression   Pulmonary follow up is as needed with pfts if not improved to your satisfaction
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
## 2020-08-03 ENCOUNTER — Encounter: Payer: Self-pay | Admitting: Internal Medicine
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
## 2020-08-05 ENCOUNTER — Ambulatory Visit: Payer: Medicare Other | Admitting: Cardiology
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
## 2020-08-17 ENCOUNTER — Ambulatory Visit (INDEPENDENT_AMBULATORY_CARE_PROVIDER_SITE_OTHER): Payer: Medicare Other | Admitting: Cardiology

## 2020-08-17 ENCOUNTER — Encounter: Payer: Self-pay | Admitting: Cardiology

## 2020-08-17 ENCOUNTER — Other Ambulatory Visit: Payer: Self-pay

## 2020-08-17 VITALS — BP 120/80 | HR 77 | Ht 70.0 in | Wt 226.0 lb

## 2020-08-17 DIAGNOSIS — I493 Ventricular premature depolarization: Secondary | ICD-10-CM | POA: Diagnosis not present

## 2020-08-17 DIAGNOSIS — I2510 Atherosclerotic heart disease of native coronary artery without angina pectoris: Secondary | ICD-10-CM

## 2020-08-17 DIAGNOSIS — I447 Left bundle-branch block, unspecified: Secondary | ICD-10-CM

## 2020-08-17 DIAGNOSIS — I5042 Chronic combined systolic (congestive) and diastolic (congestive) heart failure: Secondary | ICD-10-CM

## 2020-08-17 MED ORDER — SPIRONOLACTONE 25 MG PO TABS: 12.5000 mg | ORAL_TABLET | Freq: Every day | ORAL | 3 refills | Status: DC

## 2020-08-17 MED ORDER — LOSARTAN POTASSIUM 25 MG PO TABS: 25.0000 mg | ORAL_TABLET | Freq: Every day | ORAL | 3 refills | Status: DC

## 2020-08-17 MED ORDER — FUROSEMIDE 20 MG PO TABS: 20.0000 mg | ORAL_TABLET | ORAL | 6 refills | Status: DC | PRN

## 2020-08-17 NOTE — Patient Instructions (Signed)
Medication Instructions:  Your physician has recommended you make the following change in your medication:   1) STOP taking Lasix (furosemide) daily - take 20 mg daily as needed for swelling/weight gain  2) START taking Cozaar (losartan) 25 mg daily  3) START taking Aldactone (spironolactone) 12.5 mg daily   *If you need a refill on your cardiac medications before your next appointment, please call your pharmacy*   Lab Work: BMET in one week  If you have labs (blood work) drawn today and your tests are completely normal, you will receive your results only by: Marland Kitchen MyChart Message (if you have MyChart) OR . A paper copy in the mail If you have any lab test that is abnormal or we need to change your treatment, we will call you to review the results.   Testing/Procedures: Your physician has requested that you have an echocardiogram in 3 months. Echocardiography is a painless test that uses sound waves to create images of your heart. It provides your doctor with information about the size and shape of your heart and how well your heart's chambers and valves are working. This procedure takes approximately one hour. There are no restrictions for this procedure.  Follow-Up: At [facility], you and your health needs are our priority.  As part of our continuing mission to provide you with exceptional heart care, we have created designated Provider Care Teams.  These Care Teams include your primary Cardiologist (physician) and Advanced Practice Providers (APPs -  Physician Assistants and Nurse Practitioners) who all work together to provide you with the care you need, when you need it.  Your next appointment:   6 month(s)  The format for your next appointment:   In Person  Provider:   You may see Freada Bergeron, MD or one of the following Advanced Practice Providers on your designated Care Team:    Richardson Dopp, PA-C  [place], [place]

## 2020-08-20 NOTE — Telephone Encounter (Signed)
Pt was seen on 3/29, where further plan was discussed.  Refer to OV with Dr. Johney Frame from 3/29, for further details.

## 2020-08-25 ENCOUNTER — Other Ambulatory Visit: Payer: Medicare Other | Admitting: *Deleted

## 2020-08-25 ENCOUNTER — Other Ambulatory Visit: Payer: Self-pay

## 2020-08-25 DIAGNOSIS — I5042 Chronic combined systolic (congestive) and diastolic (congestive) heart failure: Secondary | ICD-10-CM | POA: Diagnosis not present

## 2020-08-26 LAB — BASIC METABOLIC PANEL
BUN/Creatinine Ratio: 20 (ref 10–24)
BUN: 19 mg/dL (ref 8–27)
CO2: 20 mmol/L (ref 20–29)
Calcium: 9.4 mg/dL (ref 8.6–10.2)
Chloride: 97 mmol/L (ref 96–106)
Creatinine, Ser: 0.97 mg/dL (ref 0.76–1.27)
Glucose: 86 mg/dL (ref 65–99)
Potassium: 4.2 mmol/L (ref 3.5–5.2)
Sodium: 138 mmol/L (ref 134–144)
eGFR: 86 mL/min/{1.73_m2} (ref >59)

## 2020-09-18 DIAGNOSIS — Z23 Encounter for immunization: Secondary | ICD-10-CM | POA: Diagnosis not present

## 2020-11-02 DIAGNOSIS — M1712 Unilateral primary osteoarthritis, left knee: Secondary | ICD-10-CM | POA: Diagnosis not present

## 2020-11-09 DIAGNOSIS — M25562 Pain in left knee: Secondary | ICD-10-CM | POA: Diagnosis not present

## 2020-11-09 DIAGNOSIS — M1712 Unilateral primary osteoarthritis, left knee: Secondary | ICD-10-CM | POA: Diagnosis not present

## 2020-11-10 ENCOUNTER — Ambulatory Visit (HOSPITAL_COMMUNITY): Payer: Medicare Other | Attending: Cardiology

## 2020-11-10 ENCOUNTER — Other Ambulatory Visit: Payer: Self-pay

## 2020-11-10 DIAGNOSIS — I5042 Chronic combined systolic (congestive) and diastolic (congestive) heart failure: Secondary | ICD-10-CM

## 2020-11-10 LAB — ECHOCARDIOGRAM LIMITED
Area-P 1/2: 2.86 cm2
S' Lateral: 4.05 cm

## 2020-11-11 ENCOUNTER — Telehealth: Payer: Self-pay | Admitting: *Deleted

## 2020-11-11 DIAGNOSIS — I77819 Aortic ectasia, unspecified site: Secondary | ICD-10-CM

## 2020-11-11 DIAGNOSIS — I2510 Atherosclerotic heart disease of native coronary artery without angina pectoris: Secondary | ICD-10-CM

## 2020-11-11 DIAGNOSIS — Z0189 Encounter for other specified special examinations: Secondary | ICD-10-CM

## 2020-11-11 NOTE — Telephone Encounter (Signed)
Pt made aware of echo results and recommendations per Dr. Johney Frame. Pt aware that I will go ahead and place the order for his CT Angio Chest Aorta to be done in one year in the system, and send a message to our CT Scheduler, to call him back and arrange this appt. Advised the pt to continue his current med regimen and follow-up as planned in August with Dr. Johney Frame. Pt verbalized understanding and agrees with this plan.

## 2020-11-11 NOTE — Telephone Encounter (Signed)
-----   Message from Freada Bergeron, MD sent at 11/11/2020  8:31 AM EDT ----- His echo shows stable pumping function at 40-45% with no significant valve disease. His aorta is just a little dilated which we will monitor with yearly CTA/MRAs. He should continue his current medications. We will watch his pumping function yearly for now to make sure it stays stable.

## 2020-11-16 DIAGNOSIS — M1712 Unilateral primary osteoarthritis, left knee: Secondary | ICD-10-CM | POA: Diagnosis not present

## 2020-12-22 DIAGNOSIS — Z125 Encounter for screening for malignant neoplasm of prostate: Secondary | ICD-10-CM | POA: Diagnosis not present

## 2020-12-22 DIAGNOSIS — Z0000 Encounter for general adult medical examination without abnormal findings: Secondary | ICD-10-CM | POA: Diagnosis not present

## 2020-12-22 DIAGNOSIS — J309 Allergic rhinitis, unspecified: Secondary | ICD-10-CM | POA: Diagnosis not present

## 2020-12-22 DIAGNOSIS — M25562 Pain in left knee: Secondary | ICD-10-CM | POA: Diagnosis not present

## 2020-12-22 DIAGNOSIS — E782 Mixed hyperlipidemia: Secondary | ICD-10-CM | POA: Diagnosis not present

## 2020-12-22 DIAGNOSIS — N529 Male erectile dysfunction, unspecified: Secondary | ICD-10-CM | POA: Diagnosis not present

## 2020-12-22 DIAGNOSIS — N400 Enlarged prostate without lower urinary tract symptoms: Secondary | ICD-10-CM | POA: Diagnosis not present

## 2020-12-22 DIAGNOSIS — Z6831 Body mass index (BMI) 31.0-31.9, adult: Secondary | ICD-10-CM | POA: Diagnosis not present

## 2020-12-22 DIAGNOSIS — N1831 Chronic kidney disease, stage 3a: Secondary | ICD-10-CM | POA: Diagnosis not present

## 2020-12-22 DIAGNOSIS — F411 Generalized anxiety disorder: Secondary | ICD-10-CM | POA: Diagnosis not present

## 2020-12-22 DIAGNOSIS — Z1389 Encounter for screening for other disorder: Secondary | ICD-10-CM | POA: Diagnosis not present

## 2021-01-03 ENCOUNTER — Ambulatory Visit: Payer: Medicare Other | Admitting: Cardiology

## 2021-01-14 ENCOUNTER — Other Ambulatory Visit: Payer: Self-pay

## 2021-01-14 ENCOUNTER — Ambulatory Visit (INDEPENDENT_AMBULATORY_CARE_PROVIDER_SITE_OTHER): Payer: Medicare Other | Admitting: Family

## 2021-01-14 ENCOUNTER — Encounter (HOSPITAL_BASED_OUTPATIENT_CLINIC_OR_DEPARTMENT_OTHER): Payer: Self-pay | Admitting: Family

## 2021-01-14 VITALS — BP 110/74 | HR 80 | Resp 20 | Ht 69.0 in | Wt 231.0 lb

## 2021-01-14 DIAGNOSIS — I447 Left bundle-branch block, unspecified: Secondary | ICD-10-CM | POA: Diagnosis not present

## 2021-01-14 DIAGNOSIS — I25118 Atherosclerotic heart disease of native coronary artery with other forms of angina pectoris: Secondary | ICD-10-CM | POA: Diagnosis not present

## 2021-01-14 DIAGNOSIS — I493 Ventricular premature depolarization: Secondary | ICD-10-CM

## 2021-01-14 DIAGNOSIS — E785 Hyperlipidemia, unspecified: Secondary | ICD-10-CM

## 2021-01-14 DIAGNOSIS — I5042 Chronic combined systolic (congestive) and diastolic (congestive) heart failure: Secondary | ICD-10-CM | POA: Diagnosis not present

## 2021-01-14 MED ORDER — ROSUVASTATIN CALCIUM 40 MG PO TABS: 40.0000 mg | ORAL_TABLET | Freq: Every day | ORAL | 3 refills | Status: DC

## 2021-01-14 NOTE — Patient Instructions (Signed)
Medication Instructions:  Your physician has recommended you make the following change in your medication:    CHANGE Rosuvastatin to '40mg'$  daily *This is to get your LDL (bad cholesterol) to goal of <70. On most recent lab work it was 21.   *If you need a refill on your cardiac medications before your next appointment, please call your pharmacy*   Lab Work: Your physician recommends that you return for lab work in 2 months at [facility]. This will let us recheck your cholesterol levels. We will collect a lipid panel and CMP.  If you have labs (blood work) drawn today and your tests are completely normal, you will receive your results only by: [place] (if you have MyChart) OR A paper copy in the mail If you have any lab test that is abnormal or we need to change your treatment, we will call you to review the results.   Testing/Procedures: None ordered today.   Follow-Up: At [facility], you and your health needs are our priority.  As part of our continuing mission to provide you with exceptional heart care, we have created designated Provider Care Teams.  These Care Teams include your primary Cardiologist (physician) and Advanced Practice Providers (APPs -  Physician Assistants and Nurse Practitioners) who all work together to provide you with the care you need, when you need it.  We recommend signing up for the patient portal called "MyChart".  Sign up information is provided on this After Visit Summary.  MyChart is used to connect with patients for Virtual Visits (Telemedicine).  Patients are able to view lab/test results, encounter notes, upcoming appointments, etc.  Non-urgent messages can be sent to your provider as well.   To learn more about what you can do with MyChart, go to [email or web address].    Your next appointment:   With Dr. Johney Frame in January or February  Other Instructions  Recommend drinking less than 2 liters of fluid (64 oz) per day.   Exercise  recommendations: The American Heart Association recommends 150 minutes of moderate intensity exercise weekly. Try 30 minutes of moderate intensity exercise 4-5 times per week. This could include walking, jogging, or swimming. Keep up the good work with your swimming and biking!  Below are your recent cholesterol labs:   Type of cholesterol Labs  Goal  Total 181 Less than 200  HDL 42 More than 40  LDL 88 Less than 70  Triglycerides 302 Less than 150      The American Heart Association has a wonderful website with resources on how to lower your cholesterol. I've included a link below. The focus is to choose good fats (avocados, salt-free nuts, avocado oil, olive oil) rather than bad fats (fried foods, canola oil). The AHA descrbes a cholesterol-lowering diet as:    "Reducing these fats means limiting your intake of red meat and dairy products made with whole milk. Choose skim milk, low-fat or fat-free dairy products instead. It also means limiting fried food and cooking with healthy oils, such as vegetable oil. A heart-healthy diet emphasizes fruits, vegetables, whole grains, poultry, fish, nuts and nontropical vegetable oils, while limiting red and processed meats, sodium and sugar-sweetened foods and beverages."
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
## 2021-01-16 ENCOUNTER — Encounter (HOSPITAL_BASED_OUTPATIENT_CLINIC_OR_DEPARTMENT_OTHER): Payer: Self-pay | Admitting: Family

## 2021-01-16 ENCOUNTER — Encounter (HOSPITAL_BASED_OUTPATIENT_CLINIC_OR_DEPARTMENT_OTHER): Payer: Self-pay

## 2021-01-27 ENCOUNTER — Encounter (HOSPITAL_BASED_OUTPATIENT_CLINIC_OR_DEPARTMENT_OTHER): Payer: Self-pay

## 2021-02-02 DIAGNOSIS — E782 Mixed hyperlipidemia: Secondary | ICD-10-CM | POA: Diagnosis not present

## 2021-02-02 DIAGNOSIS — M545 Low back pain, unspecified: Secondary | ICD-10-CM | POA: Diagnosis not present

## 2021-02-10 DIAGNOSIS — M25562 Pain in left knee: Secondary | ICD-10-CM | POA: Diagnosis not present

## 2021-02-10 DIAGNOSIS — M1712 Unilateral primary osteoarthritis, left knee: Secondary | ICD-10-CM | POA: Diagnosis not present

## 2021-02-17 DIAGNOSIS — J342 Deviated nasal septum: Secondary | ICD-10-CM | POA: Diagnosis not present

## 2021-02-17 DIAGNOSIS — J310 Chronic rhinitis: Secondary | ICD-10-CM | POA: Diagnosis not present

## 2021-02-17 DIAGNOSIS — J343 Hypertrophy of nasal turbinates: Secondary | ICD-10-CM | POA: Diagnosis not present

## 2021-02-18 ENCOUNTER — Telehealth: Payer: Self-pay | Admitting: *Deleted

## 2021-02-18 ENCOUNTER — Other Ambulatory Visit: Payer: Self-pay | Admitting: Otolaryngology

## 2021-02-18 MED ORDER — ROSUVASTATIN CALCIUM 10 MG PO TABS: 10.0000 mg | ORAL_TABLET | Freq: Every day | ORAL | 1 refills | Status: DC

## 2021-02-18 MED ORDER — EZETIMIBE 10 MG PO TABS: 10.0000 mg | ORAL_TABLET | Freq: Every day | ORAL | 1 refills | Status: DC

## 2021-02-18 NOTE — Telephone Encounter (Signed)
   [facility] Pre-operative Risk Assessment    Patient Name: Glenn Murray  DOB: 10-08-1950 MRN: [id number]  HEARTCARE STAFF:  - IMPORTANT!!!!!! Under Visit Info/Reason for Call, type in Other and utilize the format Clearance MM/DD/YY or Clearance TBD. Do not use dashes or single digits. - Please review there is not already an duplicate clearance open for this procedure. - If request is for dental extraction, please clarify the # of teeth to be extracted. - If the patient is currently at the dentist's office, call Pre-Op Callback Staff (MA/nurse) to input urgent request.  - If the patient is not currently in the dentist office, please route to the Pre-Op pool.  Request for surgical clearance:  What type of surgery is being performed? SEPTOPLASTY, B/L TURBINATE REDUCTION  When is this surgery scheduled? TBD  What type of clearance is required (medical clearance vs. Pharmacy clearance to hold med vs. Both)? MEDICAL  Are there any medications that need to be held prior to surgery and how long?  ASA   Practice name and name of physician performing surgery? DR. Tamsen Roers [facility]  What is the office phone number? [phone]   7.   What is the office fax number? [phone]  8.   Anesthesia type (None, local, MAC, general) ? GENERAL   Julaine Hua 02/18/2021, 1:54 PM  _________________________________________________________________   (provider comments below)

## 2021-02-18 NOTE — Telephone Encounter (Signed)
Left VM for clearance.

## 2021-02-21 NOTE — Telephone Encounter (Signed)
   Primary Cardiologist: Freada Bergeron, MD  Chart reviewed as part of pre-operative protocol coverage. Given past medical history and time since last visit, based on ACC/AHA guidelines, Glenn Murray would be at acceptable risk for the planned procedure without further cardiovascular testing.   His aspirin may be held for 5 to 7 days prior to his surgery.  Please resume as soon as hemostasis is achieved.  I will route this recommendation to the requesting party via Epic fax function and remove from pre-op pool.  Please call with questions.  Jossie Ng. Derak Schurman NP-C    02/21/2021, 4:04 PM [place] [address] Office [phone] Fax [phone]

## 2021-02-23 DIAGNOSIS — Z23 Encounter for immunization: Secondary | ICD-10-CM | POA: Diagnosis not present

## 2021-03-31 ENCOUNTER — Ambulatory Visit: Payer: Self-pay | Admitting: Orthopedic Surgery

## 2021-03-31 DIAGNOSIS — M1712 Unilateral primary osteoarthritis, left knee: Secondary | ICD-10-CM

## 2021-03-31 DIAGNOSIS — M1909 Primary osteoarthritis, other specified site: Secondary | ICD-10-CM

## 2021-04-05 ENCOUNTER — Encounter (HOSPITAL_BASED_OUTPATIENT_CLINIC_OR_DEPARTMENT_OTHER): Payer: Self-pay

## 2021-04-07 DIAGNOSIS — E785 Hyperlipidemia, unspecified: Secondary | ICD-10-CM | POA: Diagnosis not present

## 2021-04-07 LAB — LIPID PANEL
Chol/HDL Ratio: 3.7 ratio (ref 0.0–5.0)
Cholesterol, Total: 157 mg/dL (ref 100–199)
HDL: 43 mg/dL (ref >39)
LDL Chol Calc (NIH): 73 mg/dL (ref 0–99)
Triglycerides: 249 mg/dL — ABNORMAL HIGH (ref 0–149)
VLDL Cholesterol Cal: 41 mg/dL — ABNORMAL HIGH (ref 5–40)

## 2021-04-07 LAB — COMPREHENSIVE METABOLIC PANEL
ALT: 35 IU/L (ref 0–44)
AST: 27 IU/L (ref 0–40)
Albumin/Globulin Ratio: 2.1 (ref 1.2–2.2)
Albumin: 4.8 g/dL (ref 3.8–4.8)
Alkaline Phosphatase: 69 IU/L (ref 44–121)
BUN/Creatinine Ratio: 27 — ABNORMAL HIGH (ref 10–24)
BUN: 24 mg/dL (ref 8–27)
Bilirubin Total: 0.4 mg/dL (ref 0.0–1.2)
CO2: 22 mmol/L (ref 20–29)
Calcium: 9.1 mg/dL (ref 8.6–10.2)
Chloride: 101 mmol/L (ref 96–106)
Creatinine, Ser: 0.89 mg/dL (ref 0.76–1.27)
Globulin, Total: 2.3 g/dL (ref 1.5–4.5)
Glucose: 98 mg/dL (ref 70–99)
Potassium: 4.4 mmol/L (ref 3.5–5.2)
Sodium: 138 mmol/L (ref 134–144)
Total Protein: 7.1 g/dL (ref 6.0–8.5)
eGFR: 94 mL/min/{1.73_m2} (ref >59)

## 2021-04-08 ENCOUNTER — Encounter (HOSPITAL_BASED_OUTPATIENT_CLINIC_OR_DEPARTMENT_OTHER): Payer: Self-pay

## 2021-04-08 MED ORDER — ROSUVASTATIN CALCIUM 10 MG PO TABS: 10.0000 mg | ORAL_TABLET | Freq: Every day | ORAL | 3 refills | Status: DC

## 2021-04-08 MED ORDER — EZETIMIBE 10 MG PO TABS: 10.0000 mg | ORAL_TABLET | Freq: Every day | ORAL | 3 refills | Status: DC

## 2021-04-11 NOTE — Progress Notes (Signed)
Surgical Instructions    Your procedure is scheduled on 04/20/21.  Report to [facility] Main Entrance "A" at 8:00 A.M., then check in with the Admitting office.  Call this number if you have problems the morning of surgery:  [phone]   If you have any questions prior to your surgery date call [phone]: Open Monday-Friday 8am-4pm    Remember:  Do not eat after midnight the night before your surgery  You may drink clear liquids until 7:00 the morning of your surgery.   Clear liquids allowed are: Water, Non-Citrus Juices (without pulp), Carbonated Beverages, Clear Tea, Black Coffee ONLY (NO MILK, CREAM OR POWDERED CREAMER of any kind), and Gatorade    Take these medicines the morning of surgery with A SIP OF WATER:  budesonide (RHINOCORT AQUA) ezetimibe (ZETIA) loratadine (CLARITIN)  metoprolol succinate (TOPROL XL) rosuvastatin (CRESTOR)   As Needed: acetaminophen (TYLENOL)  clonazePAM (KLONOPIN)   As of today, STOP taking any Aspirin (unless otherwise instructed by your surgeon) Aleve, Naproxen, Ibuprofen, Motrin, Advil, Goody's, BC's, all herbal medications, fish oil, and all vitamins.     After your COVID test   You are not required to quarantine however you are required to wear a well-fitting mask when you are out and around people not in your household.  If your mask becomes wet or soiled, replace with a new one.  Wash your hands often with soap and water for 20 seconds or clean your hands with an alcohol-based hand sanitizer that contains at least 60% alcohol.  Do not share personal items.  Notify your provider: if you are in close contact with someone who has COVID  or if you develop a fever of 100.4 or greater, sneezing, cough, sore throat, shortness of breath or body aches.           Do not wear jewelry  Do not wear lotions, powders, colognes, or deodorant. Do not shave 48 hours prior to surgery.  Men may shave face and neck. Do not bring valuables to  the hospital.              [facility] is not responsible for any belongings or valuables.  Do NOT Smoke (Tobacco/Vaping)  24 hours prior to your procedure  If you use a CPAP at night, you may bring your mask for your overnight stay.   Contacts, glasses, hearing aids, dentures or partials may not be worn into surgery, please bring cases for these belongings   For patients admitted to the hospital, discharge time will be determined by your treatment team.   Patients discharged the day of surgery will not be allowed to drive home, and someone needs to stay with them for 24 hours.  NO VISITORS WILL BE ALLOWED IN PRE-OP WHERE PATIENTS ARE PREPPED FOR SURGERY.  ONLY 1 SUPPORT PERSON MAY BE PRESENT IN THE WAITING ROOM WHILE YOU ARE IN SURGERY.  IF YOU ARE TO BE ADMITTED, ONCE YOU ARE IN YOUR ROOM YOU WILL BE ALLOWED TWO (2) VISITORS. 1 (ONE) VISITOR MAY STAY OVERNIGHT BUT MUST ARRIVE TO THE ROOM BY 8pm.  Minor children may have two parents present. Special consideration for safety and communication needs will be reviewed on a case by case basis.  Special instructions:    Oral Hygiene is also important to reduce your risk of infection.  Remember - BRUSH YOUR TEETH THE MORNING OF SURGERY WITH YOUR REGULAR TOOTHPASTE   [place]- Preparing For Surgery  Before surgery, you can play an important  role. Because skin is not sterile, your skin needs to be as free of germs as possible. You can reduce the number of germs on your skin by washing with CHG (chlorahexidine gluconate) Soap before surgery.  CHG is an antiseptic cleaner which kills germs and bonds with the skin to continue killing germs even after washing.     Please do not use if you have an allergy to CHG or antibacterial soaps. If your skin becomes reddened/irritated stop using the CHG.  Do not shave (including legs and underarms) for at least 48 hours prior to first CHG shower. It is OK to shave your face.  Please follow these  instructions carefully.     Shower the NIGHT BEFORE SURGERY and the MORNING OF SURGERY with CHG Soap.   If you chose to wash your hair, wash your hair first as usual with your normal shampoo. After you shampoo, rinse your hair and body thoroughly to remove the shampoo.  Then [employer] and genitals (private parts) with your normal soap and rinse thoroughly to remove soap.  After that Use CHG Soap as you would any other liquid soap. You can apply CHG directly to the skin and wash gently with a scrungie or a clean washcloth.   Apply the CHG Soap to your body ONLY FROM THE NECK DOWN.  Do not use on open wounds or open sores. Avoid contact with your eyes, ears, mouth and genitals (private parts). Wash Face and genitals (private parts)  with your normal soap.   Wash thoroughly, paying special attention to the area where your surgery will be performed.  Thoroughly rinse your body with warm water from the neck down.  DO NOT shower/wash with your normal soap after using and rinsing off the CHG Soap.  Pat yourself dry with a CLEAN TOWEL.  Wear CLEAN PAJAMAS to bed the night before surgery  Place CLEAN SHEETS on your bed the night before your surgery  DO NOT SLEEP WITH PETS.   Day of Surgery:  Take a shower with CHG soap. Wear Clean/Comfortable clothing the morning of surgery Do not apply any deodorants/lotions.   Remember to brush your teeth WITH YOUR REGULAR TOOTHPASTE.   Please read over the following fact sheets that you were given.

## 2021-04-12 ENCOUNTER — Encounter (HOSPITAL_COMMUNITY)
Admission: RE | Admit: 2021-04-12 | Discharge: 2021-04-12 | Disposition: A | Discharge status: Home / Self Care | Payer: Medicare Other | Source: Ambulatory Visit | Attending: Otolaryngology | Admitting: Otolaryngology

## 2021-04-12 ENCOUNTER — Encounter (HOSPITAL_COMMUNITY): Payer: Self-pay

## 2021-04-12 ENCOUNTER — Other Ambulatory Visit: Payer: Self-pay

## 2021-04-12 VITALS — BP 141/83 | HR 68 | Temp 98.3°F | Resp 18 | Ht 69.0 in | Wt 227.7 lb

## 2021-04-12 DIAGNOSIS — Z01812 Encounter for preprocedural laboratory examination: Secondary | ICD-10-CM | POA: Diagnosis not present

## 2021-04-12 DIAGNOSIS — Z01818 Encounter for other preprocedural examination: Secondary | ICD-10-CM

## 2021-04-12 HISTORY — DX: Gastro-esophageal reflux disease without esophagitis: K21.9

## 2021-04-12 HISTORY — DX: Dyspnea, unspecified: R06.00

## 2021-04-12 LAB — CBC
HCT: 43.3 % (ref 39.0–52.0)
Hemoglobin: 14.6 g/dL (ref 13.0–17.0)
MCH: 31.4 pg (ref 26.0–34.0)
MCHC: 33.7 g/dL (ref 30.0–36.0)
MCV: 93.1 fL (ref 80.0–100.0)
Platelets: 167 10*3/uL (ref 150–400)
RBC: 4.65 MIL/uL (ref 4.22–5.81)
RDW: 12.4 % (ref 11.5–15.5)
WBC: 6.1 10*3/uL (ref 4.0–10.5)
nRBC: 0 % (ref 0.0–0.2)
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
## 2021-04-18 ENCOUNTER — Other Ambulatory Visit (HOSPITAL_COMMUNITY)
Admission: RE | Admit: 2021-04-18 | Discharge: 2021-04-18 | Disposition: A | Discharge status: Home / Self Care | Payer: Medicare Other | Source: Ambulatory Visit | Attending: Otolaryngology | Admitting: Otolaryngology

## 2021-04-18 DIAGNOSIS — Z01812 Encounter for preprocedural laboratory examination: Secondary | ICD-10-CM | POA: Diagnosis not present

## 2021-04-18 DIAGNOSIS — Z20822 Contact with and (suspected) exposure to covid-19: Secondary | ICD-10-CM | POA: Insufficient documentation

## 2021-04-18 DIAGNOSIS — Z01818 Encounter for other preprocedural examination: Secondary | ICD-10-CM

## 2021-04-18 LAB — SARS CORONAVIRUS 2 (TAT 6-24 HRS): SARS Coronavirus 2: NEGATIVE

## 2021-04-20 ENCOUNTER — Ambulatory Visit (HOSPITAL_COMMUNITY): Payer: Medicare Other | Admitting: Anesthesiology

## 2021-04-20 ENCOUNTER — Ambulatory Visit (HOSPITAL_COMMUNITY)
Admission: RE | Admit: 2021-04-20 | Discharge: 2021-04-21 | Disposition: A | Discharge status: Home / Self Care | Payer: Medicare Other | Attending: Otolaryngology | Admitting: Otolaryngology

## 2021-04-20 ENCOUNTER — Encounter (HOSPITAL_COMMUNITY): Payer: Self-pay | Admitting: Otolaryngology

## 2021-04-20 ENCOUNTER — Other Ambulatory Visit: Payer: Self-pay

## 2021-04-20 ENCOUNTER — Ambulatory Visit (HOSPITAL_COMMUNITY): Payer: Medicare Other | Admitting: Physician Assistant

## 2021-04-20 ENCOUNTER — Encounter (HOSPITAL_COMMUNITY)
Admission: RE | Disposition: A | Discharge status: Home / Self Care | Payer: Self-pay | Source: Home / Self Care | Attending: Otolaryngology

## 2021-04-20 DIAGNOSIS — K219 Gastro-esophageal reflux disease without esophagitis: Secondary | ICD-10-CM | POA: Insufficient documentation

## 2021-04-20 DIAGNOSIS — I2510 Atherosclerotic heart disease of native coronary artery without angina pectoris: Secondary | ICD-10-CM | POA: Insufficient documentation

## 2021-04-20 DIAGNOSIS — J3489 Other specified disorders of nose and nasal sinuses: Secondary | ICD-10-CM | POA: Diagnosis not present

## 2021-04-20 DIAGNOSIS — Z9889 Other specified postprocedural states: Secondary | ICD-10-CM | POA: Diagnosis present

## 2021-04-20 DIAGNOSIS — M1909 Primary osteoarthritis, other specified site: Secondary | ICD-10-CM

## 2021-04-20 DIAGNOSIS — M1712 Unilateral primary osteoarthritis, left knee: Secondary | ICD-10-CM

## 2021-04-20 DIAGNOSIS — J343 Hypertrophy of nasal turbinates: Secondary | ICD-10-CM | POA: Diagnosis not present

## 2021-04-20 DIAGNOSIS — J342 Deviated nasal septum: Secondary | ICD-10-CM | POA: Insufficient documentation

## 2021-04-20 DIAGNOSIS — I10 Essential (primary) hypertension: Secondary | ICD-10-CM | POA: Diagnosis not present

## 2021-04-20 DIAGNOSIS — E785 Hyperlipidemia, unspecified: Secondary | ICD-10-CM | POA: Diagnosis not present

## 2021-04-20 HISTORY — PX: NASAL SEPTOPLASTY W/ TURBINOPLASTY: SHX2070

## 2021-04-20 LAB — CBC
HCT: 41.7 % (ref 39.0–52.0)
Hemoglobin: 14.4 g/dL (ref 13.0–17.0)
MCH: 31.7 pg (ref 26.0–34.0)
MCHC: 34.5 g/dL (ref 30.0–36.0)
MCV: 91.9 fL (ref 80.0–100.0)
Platelets: 159 10*3/uL (ref 150–400)
RBC: 4.54 MIL/uL (ref 4.22–5.81)
RDW: 12.6 % (ref 11.5–15.5)
WBC: 10.9 10*3/uL — ABNORMAL HIGH (ref 4.0–10.5)
nRBC: 0 % (ref 0.0–0.2)

## 2021-04-20 LAB — BASIC METABOLIC PANEL
Anion gap: 9 (ref 5–15)
BUN: 21 mg/dL (ref 8–23)
CO2: 24 mmol/L (ref 22–32)
Calcium: 8.9 mg/dL (ref 8.9–10.3)
Chloride: 102 mmol/L (ref 98–111)
Creatinine, Ser: 1.14 mg/dL (ref 0.61–1.24)
GFR, Estimated: >60 mL/min (ref >60)
Glucose, Bld: 204 mg/dL — ABNORMAL HIGH (ref 70–99)
Potassium: 4.1 mmol/L (ref 3.5–5.1)
Sodium: 135 mmol/L (ref 135–145)

## 2021-04-20 LAB — PROTIME-INR
INR: 1 (ref 0.8–1.2)
Prothrombin Time: 13.5 seconds (ref 11.4–15.2)

## 2021-04-20 SURGERY — SEPTOPLASTY, NOSE, WITH NASAL TURBINATE REDUCTION
Anesthesia: General | Site: Nose | Laterality: Bilateral

## 2021-04-20 MED ORDER — METOPROLOL SUCCINATE ER 25 MG PO TB24
25.0000 mg | ORAL_TABLET | Freq: Every day | ORAL | Status: DC
  Administered 2021-04-21: 25 mg via ORAL
  Filled 2021-04-20: qty 1

## 2021-04-20 MED ORDER — FENTANYL CITRATE (PF) 250 MCG/5ML IJ SOLN
INTRAMUSCULAR | Status: AC
  Filled 2021-04-20: qty 5

## 2021-04-20 MED ORDER — LIDOCAINE-EPINEPHRINE 1 %-1:100000 IJ SOLN
INTRAMUSCULAR | Status: AC
  Filled 2021-04-20: qty 1

## 2021-04-20 MED ORDER — MENTHOL 3 MG MT LOZG
1.0000 | LOZENGE | OROMUCOSAL | Status: DC | PRN
  Administered 2021-04-20: 3 mg via ORAL
  Filled 2021-04-20: qty 9

## 2021-04-20 MED ORDER — OXYMETAZOLINE HCL 0.05 % NA SOLN
NASAL | Status: AC
  Filled 2021-04-20: qty 30

## 2021-04-20 MED ORDER — OXYCODONE-ACETAMINOPHEN 5-325 MG PO TABS
1.0000 | ORAL_TABLET | ORAL | Status: DC | PRN
  Administered 2021-04-20: 1 via ORAL

## 2021-04-20 MED ORDER — AMOXICILLIN 875 MG PO TABS: 875.0000 mg | ORAL_TABLET | Freq: Two times a day (BID) | ORAL | 0 refills | Status: AC

## 2021-04-20 MED ORDER — ORAL CARE MOUTH RINSE: 15.0000 mL | Freq: Once | OROMUCOSAL | Status: AC

## 2021-04-20 MED ORDER — ONDANSETRON HCL 4 MG PO TABS: 4.0000 mg | ORAL_TABLET | ORAL | Status: DC | PRN

## 2021-04-20 MED ORDER — DEXAMETHASONE SODIUM PHOSPHATE 10 MG/ML IJ SOLN
INTRAMUSCULAR | Status: AC
  Filled 2021-04-20: qty 1

## 2021-04-20 MED ORDER — FENTANYL CITRATE (PF) 100 MCG/2ML IJ SOLN
INTRAMUSCULAR | Status: DC | PRN
  Administered 2021-04-20: 50 ug via INTRAVENOUS
  Administered 2021-04-20: 100 ug via INTRAVENOUS

## 2021-04-20 MED ORDER — CLONAZEPAM 0.5 MG PO TABS
0.5000 mg | ORAL_TABLET | Freq: Two times a day (BID) | ORAL | Status: DC | PRN
Start: 2021-04-20 — End: 2021-04-21

## 2021-04-20 MED ORDER — MORPHINE SULFATE (PF) 2 MG/ML IV SOLN: 2.0000 mg | INTRAVENOUS | Status: DC | PRN

## 2021-04-20 MED ORDER — ONDANSETRON HCL 4 MG/2ML IJ SOLN
INTRAMUSCULAR | Status: DC | PRN
  Administered 2021-04-20: 4 mg via INTRAVENOUS

## 2021-04-20 MED ORDER — DEXAMETHASONE SODIUM PHOSPHATE 4 MG/ML IJ SOLN
INTRAMUSCULAR | Status: DC | PRN
  Administered 2021-04-20: 10 mg via INTRAVENOUS

## 2021-04-20 MED ORDER — PHENYLEPHRINE 40 MCG/ML (10ML) SYRINGE FOR IV PUSH (FOR BLOOD PRESSURE SUPPORT)
PREFILLED_SYRINGE | INTRAVENOUS | Status: DC | PRN
  Administered 2021-04-20 (×2): 80 ug via INTRAVENOUS

## 2021-04-20 MED ORDER — AMISULPRIDE (ANTIEMETIC) 5 MG/2ML IV SOLN: 10.0000 mg | Freq: Once | INTRAVENOUS | Status: DC | PRN

## 2021-04-20 MED ORDER — GABAPENTIN 300 MG PO CAPS
300.0000 mg | ORAL_CAPSULE | Freq: Once | ORAL | Status: AC
  Administered 2021-04-20: 300 mg via ORAL
  Filled 2021-04-20: qty 1

## 2021-04-20 MED ORDER — ACETAMINOPHEN 500 MG PO TABS: 1000.0000 mg | ORAL_TABLET | Freq: Once | ORAL | Status: DC

## 2021-04-20 MED ORDER — ARTIFICIAL TEARS OPHTHALMIC OINT
TOPICAL_OINTMENT | OPHTHALMIC | Status: AC
  Filled 2021-04-20: qty 3.5

## 2021-04-20 MED ORDER — BACITRACIN ZINC 500 UNIT/GM EX OINT
TOPICAL_OINTMENT | CUTANEOUS | Status: AC
  Filled 2021-04-20: qty 28.35

## 2021-04-20 MED ORDER — CHLORHEXIDINE GLUCONATE 0.12 % MT SOLN
OROMUCOSAL | Status: AC
  Administered 2021-04-20: 15 mL via OROMUCOSAL
  Filled 2021-04-20: qty 15

## 2021-04-20 MED ORDER — KCL IN DEXTROSE-NACL 20-5-0.45 MEQ/L-%-% IV SOLN
INTRAVENOUS | Status: DC
  Filled 2021-04-20: qty 1000

## 2021-04-20 MED ORDER — CEFAZOLIN SODIUM-DEXTROSE 2-3 GM-%(50ML) IV SOLR
INTRAVENOUS | Status: DC | PRN
  Administered 2021-04-20: 2 g via INTRAVENOUS

## 2021-04-20 MED ORDER — ROSUVASTATIN CALCIUM 5 MG PO TABS
10.0000 mg | ORAL_TABLET | Freq: Every day | ORAL | Status: DC
  Administered 2021-04-21: 10 mg via ORAL
  Filled 2021-04-20: qty 2

## 2021-04-20 MED ORDER — LIDOCAINE 2% (20 MG/ML) 5 ML SYRINGE
INTRAMUSCULAR | Status: DC | PRN
  Administered 2021-04-20: 60 mg via INTRAVENOUS

## 2021-04-20 MED ORDER — KETAMINE HCL 50 MG/5ML IJ SOSY
PREFILLED_SYRINGE | INTRAMUSCULAR | Status: AC
  Filled 2021-04-20: qty 10

## 2021-04-20 MED ORDER — ROCURONIUM BROMIDE 10 MG/ML (PF) SYRINGE
PREFILLED_SYRINGE | INTRAVENOUS | Status: AC
  Filled 2021-04-20: qty 10

## 2021-04-20 MED ORDER — MIDAZOLAM HCL 2 MG/2ML IJ SOLN
INTRAMUSCULAR | Status: AC
  Filled 2021-04-20: qty 2

## 2021-04-20 MED ORDER — OXYMETAZOLINE HCL 0.05 % NA SOLN
NASAL | Status: DC | PRN
  Administered 2021-04-20: 1 via TOPICAL

## 2021-04-20 MED ORDER — ONDANSETRON HCL 4 MG/2ML IJ SOLN
INTRAMUSCULAR | Status: AC
  Filled 2021-04-20: qty 2

## 2021-04-20 MED ORDER — EZETIMIBE 10 MG PO TABS
10.0000 mg | ORAL_TABLET | Freq: Every day | ORAL | Status: DC
  Administered 2021-04-21: 10 mg via ORAL
  Filled 2021-04-20: qty 1

## 2021-04-20 MED ORDER — ROCURONIUM BROMIDE 10 MG/ML (PF) SYRINGE
PREFILLED_SYRINGE | INTRAVENOUS | Status: DC | PRN
  Administered 2021-04-20: 60 mg via INTRAVENOUS

## 2021-04-20 MED ORDER — LACTATED RINGERS IV SOLN: INTRAVENOUS | Status: DC

## 2021-04-20 MED ORDER — FENTANYL CITRATE (PF) 100 MCG/2ML IJ SOLN: 25.0000 ug | INTRAMUSCULAR | Status: DC | PRN

## 2021-04-20 MED ORDER — MIDAZOLAM HCL 5 MG/5ML IJ SOLN
INTRAMUSCULAR | Status: DC | PRN
  Administered 2021-04-20: 2 mg via INTRAVENOUS

## 2021-04-20 MED ORDER — ZOLPIDEM TARTRATE 5 MG PO TABS: 5.0000 mg | ORAL_TABLET | Freq: Every evening | ORAL | Status: DC | PRN

## 2021-04-20 MED ORDER — CHLORHEXIDINE GLUCONATE 0.12 % MT SOLN: 15.0000 mL | Freq: Once | OROMUCOSAL | Status: AC

## 2021-04-20 MED ORDER — BACITRACIN ZINC 500 UNIT/GM EX OINT
TOPICAL_OINTMENT | CUTANEOUS | Status: DC | PRN
  Administered 2021-04-20: 1 via TOPICAL

## 2021-04-20 MED ORDER — PROPOFOL 10 MG/ML IV BOLUS
INTRAVENOUS | Status: AC
  Filled 2021-04-20: qty 20

## 2021-04-20 MED ORDER — ALFUZOSIN HCL ER 10 MG PO TB24
10.0000 mg | ORAL_TABLET | Freq: Every day | ORAL | Status: DC
  Administered 2021-04-20: 10 mg via ORAL
  Filled 2021-04-20: qty 1

## 2021-04-20 MED ORDER — ONDANSETRON HCL 4 MG/2ML IJ SOLN: 4.0000 mg | INTRAMUSCULAR | Status: DC | PRN

## 2021-04-20 MED ORDER — GLYCOPYRROLATE PF 0.2 MG/ML IJ SOSY
PREFILLED_SYRINGE | INTRAMUSCULAR | Status: DC | PRN
  Administered 2021-04-20: .2 mg via INTRAVENOUS

## 2021-04-20 MED ORDER — PROPOFOL 10 MG/ML IV BOLUS
INTRAVENOUS | Status: DC | PRN
  Administered 2021-04-20: 150 mg via INTRAVENOUS

## 2021-04-20 MED ORDER — LIDOCAINE-EPINEPHRINE 1 %-1:100000 IJ SOLN
INTRAMUSCULAR | Status: DC | PRN
  Administered 2021-04-20: 6 mL

## 2021-04-20 MED ORDER — LOSARTAN POTASSIUM 25 MG PO TABS
25.0000 mg | ORAL_TABLET | Freq: Every day | ORAL | Status: DC
  Administered 2021-04-20 – 2021-04-21 (×2): 25 mg via ORAL
  Filled 2021-04-20 (×2): qty 1

## 2021-04-20 MED ORDER — LIDOCAINE 2% (20 MG/ML) 5 ML SYRINGE
INTRAMUSCULAR | Status: AC
  Filled 2021-04-20: qty 5

## 2021-04-20 MED ORDER — CEFAZOLIN SODIUM-DEXTROSE 2-4 GM/100ML-% IV SOLN: 2.0000 g | INTRAVENOUS | Status: DC

## 2021-04-20 MED ORDER — ACETAMINOPHEN 10 MG/ML IV SOLN: 1000.0000 mg | INTRAVENOUS | Status: DC

## 2021-04-20 MED ORDER — TRANEXAMIC ACID-NACL 1000-0.7 MG/100ML-% IV SOLN
1000.0000 mg | INTRAVENOUS | Status: DC
  Filled 2021-04-20: qty 100

## 2021-04-20 MED ORDER — OXYCODONE-ACETAMINOPHEN 5-325 MG PO TABS: 1.0000 | ORAL_TABLET | ORAL | 0 refills | Status: AC | PRN

## 2021-04-20 MED ORDER — OXYCODONE-ACETAMINOPHEN 5-325 MG PO TABS
ORAL_TABLET | ORAL | Status: AC
  Filled 2021-04-20: qty 1

## 2021-04-20 MED ORDER — SUGAMMADEX SODIUM 200 MG/2ML IV SOLN
INTRAVENOUS | Status: DC | PRN
  Administered 2021-04-20: 200 mg via INTRAVENOUS

## 2021-04-20 SURGICAL SUPPLY — 27 items
BAG COUNTER SPONGE SURGICOUNT (BAG) ×2 IMPLANT
BAG SPNG CNTER NS LX DISP (BAG) ×1
CANISTER SUCT 3000ML PPV (MISCELLANEOUS) ×2 IMPLANT
COAGULATOR SUCT SWTCH 10FR 6 (ELECTROSURGICAL) ×2 IMPLANT
COVER SURGICAL LIGHT HANDLE (MISCELLANEOUS) ×1 IMPLANT
DRAPE HALF SHEET 40X57 (DRAPES) IMPLANT
ELECT REM PT RETURN 9FT ADLT (ELECTROSURGICAL) ×2
ELECTRODE REM PT RTRN 9FT ADLT (ELECTROSURGICAL) ×1 IMPLANT
GAUZE SPONGE 2X2 8PLY STRL LF (GAUZE/BANDAGES/DRESSINGS) ×1 IMPLANT
GLOVE SURG LTX SZ7.5 (GLOVE) ×2 IMPLANT
GOWN STRL REUS W/ TWL LRG LVL3 (GOWN DISPOSABLE) ×2 IMPLANT
GOWN STRL REUS W/TWL LRG LVL3 (GOWN DISPOSABLE) ×4
KIT BASIN OR (CUSTOM PROCEDURE TRAY) ×2 IMPLANT
KIT TURNOVER KIT B (KITS) ×2 IMPLANT
NDL HYPO 25GX1X1/2 BEV (NEEDLE) ×1 IMPLANT
NEEDLE HYPO 25GX1X1/2 BEV (NEEDLE) ×2 IMPLANT
NS IRRIG 1000ML POUR BTL (IV SOLUTION) ×2 IMPLANT
PAD ARMBOARD 7.5X6 YLW CONV (MISCELLANEOUS) ×4 IMPLANT
SPLINT NASAL DOYLE BI-VL (GAUZE/BANDAGES/DRESSINGS) ×2 IMPLANT
SPONGE GAUZE 2X2 STER 10/PKG (GAUZE/BANDAGES/DRESSINGS) ×1
SPONGE NEURO XRAY DETECT 1X3 (DISPOSABLE) ×2 IMPLANT
SUT CHROMIC 4 0 SH 27 (SUTURE) ×2 IMPLANT
SUT PLAIN 4 0 ~~LOC~~ 1 (SUTURE) ×2 IMPLANT
SUT PROLENE 3 0 PS 2 (SUTURE) ×2 IMPLANT
TRAY ENT MC OR (CUSTOM PROCEDURE TRAY) ×2 IMPLANT
TUBE SALEM SUMP 16 FR W/ARV (TUBING) ×1 IMPLANT
TUBING EXTENTION W/L.L. (IV SETS) IMPLANT

## 2021-04-20 NOTE — Transfer of Care (Signed)
Immediate Anesthesia Transfer of Care Note  Patient: Glenn Murray  Procedure(s) Performed: NASAL SEPTOPLASTY WITH BILATERAL TURBINATE REDUCTION (Bilateral: Nose)  Patient Location: PACU  Anesthesia Type:General  Level of Consciousness: awake and alert   Airway & Oxygen Therapy: Patient Spontanous Breathing and Patient connected to face mask oxygen  Post-op Assessment: Report given to RN and Post -op Vital signs reviewed and stable  Post vital signs: Reviewed and stable  Last Vitals:  Vitals Value Taken Time  BP 155/96 04/20/21 0848  Temp    Pulse 66 04/20/21 0849  Resp 9 04/20/21 0849  SpO2 97 % 04/20/21 0849  Vitals shown include unvalidated device data.  Last Pain:  Vitals:   04/20/21 0702  TempSrc:   PainSc: 0-No pain      Patients Stated Pain Goal: 0 (62/83/15 1761)  Complications: No notable events documented.

## 2021-04-20 NOTE — Op Note (Signed)
DATE OF PROCEDURE: 04/20/2021  OPERATIVE REPORT   SURGEON: Leta Baptist, MD   PREOPERATIVE DIAGNOSES:  1. Severe nasal septal deviation.  2. Bilateral inferior turbinate hypertrophy.  3. Chronic nasal obstruction.  POSTOPERATIVE DIAGNOSES:  1. Severe nasal septal deviation.  2. Bilateral inferior turbinate hypertrophy.  3. Chronic nasal obstruction.  PROCEDURE PERFORMED:  1. Septoplasty.  2. Bilateral partial inferior turbinate resection.   ANESTHESIA: General endotracheal tube anesthesia.   COMPLICATIONS: None.   ESTIMATED BLOOD LOSS: 50 mL.   INDICATION FOR PROCEDURE: Glenn Murray is a 70 y.o. male with a history of chronic nasal obstruction. The patient was treated with antihistamine, decongestant, and steroid nasal sprays. However, the patient continued to be symptomatic. On examination, the patient was noted to have bilateral severe inferior turbinate hypertrophy and significant nasal septal deviation, causing significant nasal obstruction. Based on the above findings, the decision was made for the patient to undergo the above-stated procedures. The risks, benefits, alternatives, and details of the procedures were discussed with the patient. Questions were invited and answered. Informed consent was obtained.   DESCRIPTION OF PROCEDURE: The patient was taken to the operating room and placed supine on the operating table. General endotracheal tube anesthesia was administered by the anesthesiologist. The patient was positioned, and prepped and draped in the standard fashion for nasal surgery. Pledgets soaked with Afrin were placed in both nasal cavities for decongestion. The pledgets were subsequently removed.   Examination of the nasal cavity revealed a severe nasal septal deviation. 1% lidocaine with 1:100,000 epinephrine was injected onto the nasal septum bilaterally. A hemitransfixion incision was made on the left side. The mucosal flap was carefully elevated on the left side. A  cartilaginous incision was made 1 cm superior to the caudal margin of the nasal septum. Mucosal flap was also elevated on the right side in the similar fashion. It should be noted that due to the severe septal deviation, the deviated portion of the cartilaginous and bony septum had to be removed in piecemeal fashion. Once the deviated portions were removed, a straight midline septum was achieved. The septum was then quilted with 4-0 plain gut sutures. The hemitransfixion incision was closed with interrupted 4-0 chromic sutures.   The inferior one half of both hypertrophied inferior turbinate was crossclamped with a Kelly clamp. The inferior one half of each inferior turbinate was then resected with a pair of cross cutting scissors. Hemostasis was achieved with a suction cautery device. Doyle splints were applied to the nasal septum.  The care of the patient was turned over to the anesthesiologist. The patient was awakened from anesthesia without difficulty. The patient was extubated and transferred to the recovery room in good condition.   OPERATIVE FINDINGS: Nasal septal deviation and bilateral inferior turbinate hypertrophy.   SPECIMEN: None.   FOLLOWUP CARE: The patient be observed overnight. The patient will follow up in my office in 2 days for splint removal.   Delicia Berens Raynelle Bring, MD

## 2021-04-20 NOTE — Discharge Instructions (Signed)

## 2021-04-20 NOTE — Anesthesia Preprocedure Evaluation (Addendum)
Anesthesia Evaluation  Patient identified by MRN, date of birth, ID band Patient awake    Reviewed: Allergy & Precautions, NPO status , Patient's Chart, lab work & pertinent test results  Airway Mallampati: II  TM Distance: >3 FB Neck ROM: Full    Dental  (+) Dental Advisory Given   Pulmonary shortness of breath,    breath sounds clear to auscultation       Cardiovascular hypertension, Pt. on medications and Pt. on home beta blockers + CAD   Rhythm:Regular Rate:Normal     Neuro/Psych negative neurological ROS     GI/Hepatic Neg liver ROS, GERD  ,  Endo/Other  negative endocrine ROS  Renal/GU CRFRenal disease     Musculoskeletal   Abdominal   Peds  Hematology negative hematology ROS (+)   Anesthesia Other Findings   Reproductive/Obstetrics                             Lab Results  Component Value Date   WBC 6.1 04/12/2021   HGB 14.6 04/12/2021   HCT 43.3 04/12/2021   MCV 93.1 04/12/2021   PLT 167 04/12/2021   Lab Results  Component Value Date   CREATININE 0.89 04/07/2021   BUN 24 04/07/2021   NA 138 04/07/2021   K 4.4 04/07/2021   CL 101 04/07/2021   CO2 22 04/07/2021    Anesthesia Physical Anesthesia Plan  ASA: 3  Anesthesia Plan: General   Post-op Pain Management: Tylenol PO (pre-op), Gabapentin PO (pre-op) and Ketamine IV   Induction: Intravenous  PONV Risk Score and Plan: 2 and Dexamethasone, Ondansetron and Treatment may vary due to age or medical condition  Airway Management Planned: Oral ETT  Additional Equipment: None  Intra-op Plan:   Post-operative Plan: Extubation in OR  Informed Consent: I have reviewed the patients History and Physical, chart, labs and discussed the procedure including the risks, benefits and alternatives for the proposed anesthesia with the patient or authorized representative who has indicated his/her understanding and acceptance.      Dental advisory given  Plan Discussed with: CRNA  Anesthesia Plan Comments:        Anesthesia Quick Evaluation

## 2021-04-20 NOTE — Anesthesia Procedure Notes (Signed)
Procedure Name: Intubation Date/Time: 04/20/2021 7:52 AM Performed by: Lieutenant Diego, CRNA Pre-anesthesia Checklist: Patient identified, Emergency Drugs available, Suction available and Patient being monitored Patient Re-evaluated:Patient Re-evaluated prior to induction Oxygen Delivery Method: Circle system utilized Preoxygenation: Pre-oxygenation with 100% oxygen Induction Type: IV induction Ventilation: Mask ventilation without difficulty Laryngoscope Size: Miller and 2 Grade View: Grade I Tube type: Oral Tube size: 7.5 mm Number of attempts: 1 Airway Equipment and Method: Stylet and Oral airway Placement Confirmation: ETT inserted through vocal cords under direct vision, positive ETCO2 and breath sounds checked- equal and bilateral Secured at: 25 cm Tube secured with: Tape Dental Injury: Teeth and Oropharynx as per pre-operative assessment

## 2021-04-20 NOTE — H&P (Signed)
Cc: Chronic nasal obstruction  HPI: The patient is a 70 year old male who returns today complaining of persistent nasal obstruction.  According to the patient, he has a history of chronic nasal obstruction for most of his life.  He underwent sinonasal surgery in the 1970's in [place], without significant improvement in his symptoms.  He had an allergy evaluation by Dr. Mosetta Anis, and was noted to be allergic to mold.  The patient was treated with antihistamine and steroid nasal sprays.  At his last visit 6 months ago, he was noted to have nasal mucosal congestion, septal deviation, left septal spur, and bilateral inferior turbinate hypertrophy.  The options of surgical intervention versus continuing medical management were discussed.  The patient elected to proceed with continuing medical management. According to the patient, his nasal obstruction has worsened.  He has not responded to medical treatment so far.  He is interested in surgical intervention.   Exam: General: Communicates without difficulty, well nourished, no acute distress. Head: Normocephalic, no evidence injury, no tenderness, facial buttresses intact without stepoff. Eyes: PERRL, EOMI. No scleral icterus, conjunctivae clear. Neuro: CN II exam reveals vision grossly intact.  No nystagmus at any point of gaze. Ears: Auricles well formed without lesions.  Ear canals are intact without mass or lesion.  No erythema or edema is appreciated.  The TMs are intact without fluid. Nose: External evaluation reveals normal support and skin without lesions.  Dorsum is intact.  Anterior rhinoscopy reveals congested and edematous mucosa over anterior aspect of the inferior turbinates and nasal septum.  No purulence is noted. Middle meatus is not well visualized. Oral:  Oral cavity and oropharynx are intact, symmetric, without erythema or edema.  Mucosa is moist without lesions. Neck: Full range of motion without pain.  There is no significant  lymphadenopathy.  No masses palpable.  Thyroid bed within normal limits to palpation.  Parotid glands and submandibular glands equal bilaterally without mass.  Trachea is midline. Neuro:  CN 2-12 grossly intact. Gait normal. Vestibular: No nystagmus at any point of gaze. A flexible scope was inserted into the right nasal cavity.  Endoscopy of the interior nasal cavity, superior, inferior, and middle meatus was performed. The sphenoid-ethmoid recess was examined. Edematous mucosa was noted.  No polyp, mass, or lesion was appreciated. Nasal septal deviation noted.  Olfactory cleft was clear.  Nasopharynx was clear.  Turbinates were hypertrophied but without mass. The procedure was repeated on the contralateral side with similar findings.  The patient tolerated the procedure well.  Assessment: 1.  Chronic rhinitis with nasal mucosal congestion, nasal septal deviation, left inferior septal spur, and bilateral inferior turbinate hypertrophy.  More than 95% of his nasal passageways are obstructed bilaterally.   2.  No polyps, mass, or lesions are noted today.   Plan: 1.  The nasal endoscopy findings are reviewed with the patient.  2.  In light of his persistent symptoms, he may benefit from undergoing surgical intervention with revision septoplasty and bilateral inferior turbinate reduction.  The risks, benefits, alternatives and details of the procedures are reviewed with the patient.  3.  The patient would like to proceed with the procedures.

## 2021-04-21 ENCOUNTER — Encounter (HOSPITAL_COMMUNITY): Payer: Self-pay | Admitting: Otolaryngology

## 2021-04-21 DIAGNOSIS — I2510 Atherosclerotic heart disease of native coronary artery without angina pectoris: Secondary | ICD-10-CM | POA: Diagnosis not present

## 2021-04-21 DIAGNOSIS — I10 Essential (primary) hypertension: Secondary | ICD-10-CM | POA: Diagnosis not present

## 2021-04-21 DIAGNOSIS — J342 Deviated nasal septum: Secondary | ICD-10-CM | POA: Diagnosis not present

## 2021-04-21 DIAGNOSIS — J343 Hypertrophy of nasal turbinates: Secondary | ICD-10-CM | POA: Diagnosis not present

## 2021-04-21 DIAGNOSIS — J3489 Other specified disorders of nose and nasal sinuses: Secondary | ICD-10-CM | POA: Diagnosis not present

## 2021-04-21 DIAGNOSIS — K219 Gastro-esophageal reflux disease without esophagitis: Secondary | ICD-10-CM | POA: Diagnosis not present

## 2021-04-21 NOTE — Discharge Summary (Signed)
Physician Discharge Summary  Patient ID: Glenn Murray MRN: [id number] DOB/AGE: 1950/12/17 70 y.o.  Admit date: 04/20/2021 Discharge date: 04/21/2021  Admission Diagnoses: Septal deviation, turbinate hypertrophy  Discharge Diagnoses: Septal deviation, turbinate hypertrophy Principal Problem:   S/P nasal septoplasty   Discharged Condition: good  Hospital Course: Pt had an uneventful overnight stay.  No significant bleeding. No stridor.   Consults: None  Significant Diagnostic Studies: None  Treatments: surgery: Septoplasty, turbinate reduction  Discharge Exam: Blood pressure 124/76, pulse 63, temperature 97.9 F (36.6 C), temperature source Oral, resp. rate 16, height 5\' 9"  (1.753 m), weight 99.8 kg, SpO2 94 %.    Disposition: Discharge disposition: 01-Home or Self Care       Discharge Instructions     Activity as tolerated - No restrictions   Complete by: As directed    Diet general   Complete by: As directed    No wound care   Complete by: As directed       Allergies as of 04/21/2021       Reactions   Atorvastatin Swelling, Other (See Comments)   Right chest swelling    Naproxen    "knocked kidney function down"        Medication List     STOP taking these medications    aspirin EC 81 MG tablet       TAKE these medications    acetaminophen 325 MG tablet Commonly known as: TYLENOL Take 650 mg by mouth every 6 (six) hours as needed for moderate pain.   alfuzosin 10 MG 24 hr tablet Commonly known as: UROXATRAL Take 10 mg by mouth at bedtime.   amoxicillin 875 MG tablet Commonly known as: AMOXIL Take 1 tablet (875 mg total) by mouth 2 (two) times daily for 3 days.   azelastine 0.1 % nasal spray Commonly known as: ASTELIN Place 2 sprays into both nostrils at bedtime.   budesonide 32 MCG/ACT nasal spray Commonly known as: RHINOCORT AQUA Place 2 sprays into both nostrils daily.   clonazePAM 0.5 MG tablet Commonly known as:  KLONOPIN Take 0.5-1 mg by mouth 2 (two) times daily as needed (social anxiety).   CO Q-10 PO Take 1 capsule by mouth daily.   ezetimibe 10 MG tablet Commonly known as: ZETIA Take 1 tablet (10 mg total) by mouth daily.   furosemide 20 MG tablet Commonly known as: LASIX Take 1 tablet (20 mg total) by mouth as needed.   loratadine 10 MG tablet Commonly known as: CLARITIN Take 10 mg by mouth daily.   losartan 25 MG tablet Commonly known as: COZAAR Take 1 tablet (25 mg total) by mouth daily.   magnesium 30 MG tablet Take 30 mg by mouth daily.   metoprolol succinate 25 MG 24 hr tablet Commonly known as: Toprol XL Take 1 tablet (25 mg total) by mouth daily.   multivitamin with minerals Tabs tablet Take 1 tablet by mouth daily. Adults 50+   oxyCODONE-acetaminophen 5-325 MG tablet Commonly known as: Percocet Take 1 tablet by mouth every 4 (four) hours as needed for up to 2 days for severe pain.   rosuvastatin 10 MG tablet Commonly known as: CRESTOR Take 1 tablet (10 mg total) by mouth daily.   spironolactone 25 MG tablet Commonly known as: ALDACTONE Take 25 mg by mouth daily.   Turmeric 500 MG Caps Take 500 mg by mouth daily.        Follow-up Information     Leta Baptist, MD Follow up on  04/22/2021.   Specialty: Otolaryngology Why: at 12:20pm Contact information: [place] [id number] [phone]                 Signed: Burley Saver 04/21/2021, 7:05 AM

## 2021-04-21 NOTE — Anesthesia Postprocedure Evaluation (Signed)
Anesthesia Post Note  Patient: JEVAUN STRICK  Procedure(s) Performed: NASAL SEPTOPLASTY WITH BILATERAL TURBINATE REDUCTION (Bilateral: Nose)     Patient location during evaluation: PACU Anesthesia Type: General Level of consciousness: awake and alert Pain management: pain level controlled Vital Signs Assessment: post-procedure vital signs reviewed and stable Respiratory status: spontaneous breathing, nonlabored ventilation, respiratory function stable and patient connected to nasal cannula oxygen Cardiovascular status: blood pressure returned to baseline and stable Postop Assessment: no apparent nausea or vomiting Anesthetic complications: no   No notable events documented.  Last Vitals:  Vitals:   04/21/21 0511 04/21/21 0905  BP: 124/76 127/73  Pulse: 63 82  Resp: 16 19  Temp: 36.6 C (!) 36.3 C  SpO2: 94% 96%    Last Pain:  Vitals:   04/21/21 0905  TempSrc: Oral  PainSc:                  Tiajuana Amass

## 2021-04-27 DIAGNOSIS — M5451 Vertebrogenic low back pain: Secondary | ICD-10-CM | POA: Diagnosis not present

## 2021-04-27 DIAGNOSIS — M5136 Other intervertebral disc degeneration, lumbar region: Secondary | ICD-10-CM | POA: Diagnosis not present

## 2021-05-18 ENCOUNTER — Ambulatory Visit: Payer: Self-pay | Admitting: Orthopedic Surgery

## 2021-05-18 DIAGNOSIS — M1712 Unilateral primary osteoarthritis, left knee: Secondary | ICD-10-CM

## 2021-05-18 DIAGNOSIS — M1909 Primary osteoarthritis, other specified site: Secondary | ICD-10-CM

## 2021-05-18 DIAGNOSIS — M2559 Pain in other specified joint: Secondary | ICD-10-CM

## 2021-05-18 NOTE — Patient Instructions (Addendum)
DUE TO COVID-19 ONLY ONE VISITOR IS ALLOWED TO COME WITH YOU AND STAY IN THE WAITING ROOM ONLY DURING PRE OP AND PROCEDURE.   **NO VISITORS ARE ALLOWED IN THE SHORT STAY AREA OR RECOVERY ROOM!!**  IF YOU WILL BE ADMITTED INTO THE HOSPITAL YOU ARE ALLOWED ONLY TWO SUPPORT PEOPLE DURING VISITATION HOURS ONLY (7 AM -8PM)    Up to two visitors ages 86+ are allowed at one time in a patient's room.  The visitors may rotate out with other people throughout the day.  Additionally, up to two children between the ages of 35 and 57 are allowed and do not count toward the number of allowed visitors.  Children within this age range must be accompanied by an adult visitor.  One adult visitor may remain with the patient overnight and must be in the room by 8 PM.  COVID SWAB TESTING MUST BE COMPLETED ON:   05-27-21 @ 9:00 AM  COME IN THROUGH MAIN ENTRANCE of [facility].  Take a seat in the lobby area to the right as you come in the main entrance.  Call [phone]  and give your name and let them know you are here for COVID testing  You are not required to quarantine, however you are required to wear a well-fitted mask when you are out and around people not in your household.  Hand Hygiene often Do NOT share personal items Notify your provider if you are in close contact with someone who has COVID or you develop fever 100.4 or greater, new onset of sneezing, cough, sore throat, shortness of breath or body aches.       Your procedure is scheduled on:  Wednesday, 06-01-21   Report to [facility] Main  Entrance     Report to admitting at 5:45 AM   Call this number if you have problems the morning of surgery [phone]   Do not eat food :After Midnight.   May have liquids until 5:30 AM day of surgery  CLEAR LIQUID DIET  Foods Allowed                                                                     Foods Excluded  Water, Black Coffee (no milk/no creamer) and tea, regular and decaf                               liquids that you cannot  Plain Jell-O in any flavor  (No red)                         see through such as: Fruit ices (not with fruit pulp)                                 milk, soups, orange juice  Iced Popsicles (No red)                                    All solid food  Apple juices Sports drinks like Gatorade (No red) Lightly seasoned clear broth or consume(fat free) Sugar     Complete one Ensure drink the morning of surgery at 5:30 AM the day of surgery.     The day of surgery:  Drink ONE (1) Pre-Surgery Clear Ensure the morning of surgery. Drink in one sitting. Do not sip.  This drink was given to you during your hospital  pre-op appointment visit. Nothing else to drink after completing the Pre-Surgery Clear Ensure          If you have questions, please contact your surgeons office.     Oral Hygiene is also important to reduce your risk of infection.                                    Remember - BRUSH YOUR TEETH THE MORNING OF SURGERY WITH YOUR REGULAR TOOTHPASTE   Do NOT smoke after Midnight  Take these medicines the morning of surgery with A SIP OF WATER:  Tylenol, Clonazepam, Claritin, Metoprolol                    Aspirin - hold x1 week prior to surgery   Stop all vitamins and herbal supplements a week before surgery             You may not have any metal on your body including  jewelry, and body piercing             Do not wear  lotions, powders, cologne, or deodorant              Men may shave face and neck.  Do not bring valuables to the hospital. [place].   Contacts, dentures or bridgework may not be worn into surgery.   Bring small overnight bag day of surgery.   Special Instructions: Bring a copy of your healthcare power of attorney and living will documents the day of surgery if you haven't scanned them in before.  Please read over the following fact sheets you were  given: IF YOU HAVE QUESTIONS ABOUT YOUR PRE OP INSTRUCTIONS PLEASE CALL [phone]   [place] [place] - Preparing for Surgery Before surgery, you can play an important role.  Because skin is not sterile, your skin needs to be as free of germs as possible.  You can reduce the number of germs on your skin by washing with CHG (chlorahexidine gluconate) soap before surgery.  CHG is an antiseptic cleaner which kills germs and bonds with the skin to continue killing germs even after washing. Please DO NOT use if you have an allergy to CHG or antibacterial soaps.  If your skin becomes reddened/irritated stop using the CHG and inform your nurse when you arrive at Short Stay. Do not shave (including legs and underarms) for at least 48 hours prior to the first CHG shower.  You may shave your face/neck.  Please follow these instructions carefully:  1.  Shower with CHG Soap the night before surgery and the  morning of surgery.  2.  If you choose to wash your hair, wash your hair first as usual with your normal  shampoo.  3.  After you shampoo, rinse your hair and body thoroughly to remove the shampoo.  4.  Use CHG as you would any other liquid soap.  You can apply chg directly to the skin and wash.  Gently with a scrungie or clean washcloth.  5.  Apply the CHG Soap to your body ONLY FROM THE NECK DOWN.   Do   not use on face/ open                           Wound or open sores. Avoid contact with eyes, ears mouth and   genitals (private parts).                       Wash face,  Genitals (private parts) with your normal soap.             6.  Wash thoroughly, paying special attention to the area where your    surgery  will be performed.  7.  Thoroughly rinse your body with warm water from the neck down.  8.  DO NOT shower/wash with your normal soap after using and rinsing off the CHG Soap.                9.  Pat yourself dry with a clean towel.            10.  Wear clean pajamas.             11.  Place clean sheets on your bed the night of your first shower and do not  sleep with pets. Day of Surgery : Do not apply any lotions/deodorants the morning of surgery.  Please wear clean clothes to the hospital/surgery center.  FAILURE TO FOLLOW THESE INSTRUCTIONS MAY RESULT IN THE CANCELLATION OF YOUR SURGERY  PATIENT SIGNATURE_________________________________  NURSE SIGNATURE__________________________________  ________________________________________________________________________

## 2021-05-18 NOTE — Progress Notes (Signed)
COVID swab appointment: 05-27-21 @ 9   COVID Vaccine Completed: Yes x2 Date COVID Vaccine completed: 07-12-19 08-05-19 Has received booster: Yes x1 03-20-20 COVID vaccine manufacturer: [employer]      Date of COVID positive in last 90 days:  PCP - Maury Dus, MD Cardiologist - Gwyndolyn Kaufman, MD  Cardiac clearance for knee surgery in note dated 01-14-21 by Laurann Montana, NP  Chest x-ray - 06-30-20 Epic EKG - 06-30-20 Epic Stress Test -  ECHO - 11-10-20 Epic Cardiac Cath - 07-07-20 Epic Pacemaker/ICD device last checked: Spinal Cord Stimulator:  Sleep Study -  CPAP -   Fasting Blood Sugar -  Checks Blood Sugar _____ times a day  Blood Thinner Instructions: Aspirin Instructions:  ASA 81.  Hold 5 to 7 days prior Last Dose:  Activity level:  Can go up a flight of stairs and perform activities of daily living without stopping and without symptoms of chest pain or shortness of breath.   Able to exercise without symptoms  Unable to go up a flight of stairs without symptoms of      Anesthesia review:  CAD, heart failure, LBBB, CKD, HTN  Patient denies shortness of breath, fever, cough and chest pain at PAT appointment   Patient verbalized understanding of instructions that were given to them at the PAT appointment. Patient was also instructed that they will need to review over the PAT instructions again at home before surgery.

## 2021-05-18 NOTE — Progress Notes (Signed)
Surgery orders requested via Epic inbox. °

## 2021-05-23 DIAGNOSIS — I5042 Chronic combined systolic (congestive) and diastolic (congestive) heart failure: Secondary | ICD-10-CM | POA: Diagnosis not present

## 2021-05-23 DIAGNOSIS — I2510 Atherosclerotic heart disease of native coronary artery without angina pectoris: Secondary | ICD-10-CM | POA: Diagnosis not present

## 2021-05-23 DIAGNOSIS — U071 COVID-19: Secondary | ICD-10-CM | POA: Diagnosis not present

## 2021-05-24 ENCOUNTER — Encounter (HOSPITAL_COMMUNITY): Payer: Medicare Other

## 2021-05-24 NOTE — Progress Notes (Signed)
Spoke to patient and he stated that he tested positive for Covid yesterday and is symptomatic.  He will notify Dr. Reather Littler office.

## 2021-05-27 ENCOUNTER — Encounter (HOSPITAL_COMMUNITY): Payer: Medicare Other
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
## 2021-06-14 NOTE — Patient Instructions (Addendum)
DUE TO COVID-19 ONLY ONE VISITOR IS ALLOWED TO COME WITH YOU AND STAY IN THE WAITING ROOM ONLY DURING PRE OP AND PROCEDURE.   **NO VISITORS ARE ALLOWED IN THE SHORT STAY AREA OR RECOVERY ROOM!!**  IF YOU WILL BE ADMITTED INTO THE HOSPITAL YOU ARE ALLOWED ONLY TWO SUPPORT PEOPLE DURING VISITATION HOURS ONLY (7 AM -8PM)   The support person(s) must pass our screening, gel in and out, and wear a mask at all times, including in the patients room. Patients must also wear a mask when staff or their support person are in the room. Visitors GUEST BADGE MUST BE WORN VISIBLY  One adult visitor may remain with you overnight and MUST be in the room by 8 P.M.  No visitors under the age of 40. Any visitor under the age of 64 must be accompanied by an adult.    COVID SWAB TESTING MUST BE COMPLETED ON:  N/A   Site: [facility] [address] [place] Enter: Main Entrance have a seat in the waiting area to the right of main entrance (DO NOT [place]!!!!!) Dial: [phone] to alert staff you have arrived  You are not required to quarantine, however you are required to wear a well-fitted mask when you are out and around people not in your household.  Hand Hygiene often Do NOT share personal items Notify your provider if you are in close contact with someone who has COVID or you develop fever 100.4 or greater, new onset of sneezing, cough, sore throat, shortness of breath or body aches.  [place], [address], must go inside of the hospital, NOT A DRIVE THRU!  (Must self quarantine after testing. Follow instructions on handout.)       Your procedure is scheduled on: 06/24/21   Report to [facility] Main Entrance    Report to admitting at : 10:30 AM   Call this number if you have problems the morning of surgery [phone]   Do not eat food :After Midnight.   May have liquids until : 10:00 AM    day of surgery  CLEAR LIQUID DIET  Foods Allowed                                                                     Foods Excluded  Water, Black Coffee and tea, regular and decaf                             liquids that you cannot  Plain Jell-O in any flavor  (No red)                                           see through such as: Fruit ices (not with fruit pulp)                                     milk, soups, orange juice  Iced Popsicles (No red)                                    All solid food                                   Apple juices Sports drinks like Gatorade (No red) Lightly seasoned clear broth or consume(fat free) Sugar Sample Menu Breakfast                                Lunch                                     Supper Cranberry juice                    Beef broth                            Chicken broth Jell-O                                     Grape juice                           Apple juice Coffee or tea                        Jell-O                                      Popsicle                                                Coffee or tea                        Coffee or tea      Complete one Ensure drink the morning of surgery 3 hours prior to scheduled surgery at 10:00 AM.   The day of surgery:  Drink ONE (1) Pre-Surgery Clear Ensure or G2 at AM the morning of surgery. Drink in one sitting. Do not sip.  This drink was given to you during your hospital  pre-op appointment visit. Nothing else to drink after completing the  Pre-Surgery Clear Ensure or G2.          If you have questions, please contact your surgeons office.     Oral Hygiene is also important to reduce your risk of infection.                                    Remember - BRUSH YOUR TEETH THE MORNING OF SURGERY WITH YOUR REGULAR TOOTHPASTE   Do NOT smoke after Midnight   Take these medicines the morning of surgery with A  SIP OF WATER: loratadine,metoprolol.Clonazepam as needed.  DO  NOT TAKE ANY ORAL DIABETIC MEDICATIONS DAY OF YOUR SURGERY                              You may not have any metal on your body including hair pins, jewelry, and body piercing             Do not wear lotions, powders, perfumes/cologne, or deodorant               Men may shave face and neck.   Do not bring valuables to the hospital. [place].   Contacts, dentures or bridgework may not be worn into surgery.   Bring small overnight bag day of surgery.    Patients discharged on the day of surgery will not be allowed to drive home.  Someone needs to stay with you for the first 24 hours after anesthesia.   Special Instructions: Bring a copy of your healthcare power of attorney and living will documents         the day of surgery if you haven't scanned them before.              Please read over the following fact sheets you were given: IF YOU HAVE QUESTIONS ABOUT YOUR PRE-OP INSTRUCTIONS PLEASE CALL [phone]     [facility] - Preparing for Surgery Before surgery, you can play an important role.  Because skin is not sterile, your skin needs to be as free of germs as possible.  You can reduce the number of germs on your skin by washing with CHG (chlorahexidine gluconate) soap before surgery.  CHG is an antiseptic cleaner which kills germs and bonds with the skin to continue killing germs even after washing. Please DO NOT use if you have an allergy to CHG or antibacterial soaps.  If your skin becomes reddened/irritated stop using the CHG and inform your nurse when you arrive at Short Stay. Do not shave (including legs and underarms) for at least 48 hours prior to the first CHG shower.  You may shave your face/neck. Please follow these instructions carefully:  1.  Shower with CHG Soap the night before surgery and the  morning of Surgery.  2.  If you choose to wash your hair, wash your hair first as usual with your  normal  shampoo.  3.  After you  shampoo, rinse your hair and body thoroughly to remove the  shampoo.                           4.  Use CHG as you would any other liquid soap.  You can apply chg directly  to the skin and wash                       Gently with a scrungie or clean washcloth.  5.  Apply the CHG Soap to your body ONLY FROM THE NECK DOWN.   Do not use on face/ open                           Wound or open sores. Avoid contact with eyes, ears mouth and genitals (private parts).  Wash face,  Genitals (private parts) with your normal soap.             6.  Wash thoroughly, paying special attention to the area where your surgery  will be performed.  7.  Thoroughly rinse your body with warm water from the neck down.  8.  DO NOT shower/wash with your normal soap after using and rinsing off  the CHG Soap.                9.  Pat yourself dry with a clean towel.            10.  Wear clean pajamas.            11.  Place clean sheets on your bed the night of your first shower and do not  sleep with pets. Day of Surgery : Do not apply any lotions/deodorants the morning of surgery.  Please wear clean clothes to the hospital/surgery center.  FAILURE TO FOLLOW THESE INSTRUCTIONS MAY RESULT IN THE CANCELLATION OF YOUR SURGERY PATIENT SIGNATURE_________________________________  NURSE SIGNATURE__________________________________  ________________________________________________________________________   Glenn Murray  An incentive spirometer is a tool that can help keep your lungs clear and active. This tool measures how well you are filling your lungs with each breath. Taking long deep breaths may help reverse or decrease the chance of developing breathing (pulmonary) problems (especially infection) following: A long period of time when you are unable to move or be active. BEFORE THE PROCEDURE  If the spirometer includes an indicator to show your best effort, your nurse or respiratory therapist will set  it to a desired goal. If possible, sit up straight or lean slightly forward. Try not to slouch. Hold the incentive spirometer in an upright position. INSTRUCTIONS FOR USE  Sit on the edge of your bed if possible, or sit up as far as you can in bed or on a chair. Hold the incentive spirometer in an upright position. Breathe out normally. Place the mouthpiece in your mouth and seal your lips tightly around it. Breathe in slowly and as deeply as possible, raising the piston or the ball toward the top of the column. Hold your breath for 3-5 seconds or for as long as possible. Allow the piston or ball to fall to the bottom of the column. Remove the mouthpiece from your mouth and breathe out normally. Rest for a few seconds and repeat Steps 1 through 7 at least 10 times every 1-2 hours when you are awake. Take your time and take a few normal breaths between deep breaths. The spirometer may include an indicator to show your best effort. Use the indicator as a goal to work toward during each repetition. After each set of 10 deep breaths, practice coughing to be sure your lungs are clear. If you have an incision (the cut made at the time of surgery), support your incision when coughing by placing a pillow or rolled up towels firmly against it. Once you are able to get out of bed, walk around indoors and cough well. You may stop using the incentive spirometer when instructed by your caregiver.  RISKS AND COMPLICATIONS Take your time so you do not get dizzy or light-headed. If you are in pain, you may need to take or ask for pain medication before doing incentive spirometry. It is harder to take a deep breath if you are having pain. AFTER USE Rest and breathe slowly and easily. It can be helpful to keep track of  a log of your progress. Your caregiver can provide you with a simple table to help with this. If you are using the spirometer at home, follow these instructions: [place] IF:  You are  having difficultly using the spirometer. You have trouble using the spirometer as often as instructed. Your pain medication is not giving enough relief while using the spirometer. You develop fever of 100.5 F (38.1 C) or higher. SEEK IMMEDIATE MEDICAL CARE IF:  You cough up bloody sputum that had not been present before. You develop fever of 102 F (38.9 C) or greater. You develop worsening pain at or near the incision site. MAKE SURE YOU:  Understand these instructions. Will watch your condition. Will get help right away if you are not doing well or get worse. Document Released: 09/18/2006 Document Revised: 07/31/2011 Document Reviewed: 11/19/2006 [facility] Patient Information 2014 [place], [place].   ________________________________________________________________________

## 2021-06-15 ENCOUNTER — Ambulatory Visit: Payer: Self-pay | Admitting: Orthopedic Surgery

## 2021-06-15 ENCOUNTER — Other Ambulatory Visit: Payer: Self-pay

## 2021-06-15 ENCOUNTER — Encounter (HOSPITAL_COMMUNITY)
Admission: RE | Admit: 2021-06-15 | Discharge: 2021-06-15 | Disposition: A | Discharge status: Home / Self Care | Payer: Medicare Other | Source: Ambulatory Visit | Attending: Specialist | Admitting: Specialist

## 2021-06-15 ENCOUNTER — Encounter (HOSPITAL_COMMUNITY): Payer: Self-pay

## 2021-06-15 DIAGNOSIS — Z01812 Encounter for preprocedural laboratory examination: Secondary | ICD-10-CM | POA: Diagnosis not present

## 2021-06-15 DIAGNOSIS — I5020 Unspecified systolic (congestive) heart failure: Secondary | ICD-10-CM | POA: Diagnosis not present

## 2021-06-15 DIAGNOSIS — M1712 Unilateral primary osteoarthritis, left knee: Secondary | ICD-10-CM | POA: Diagnosis not present

## 2021-06-15 DIAGNOSIS — I130 Hypertensive heart and chronic kidney disease with heart failure and stage 1 through stage 4 chronic kidney disease, or unspecified chronic kidney disease: Secondary | ICD-10-CM | POA: Diagnosis not present

## 2021-06-15 DIAGNOSIS — M1909 Primary osteoarthritis, other specified site: Secondary | ICD-10-CM | POA: Insufficient documentation

## 2021-06-15 DIAGNOSIS — M2559 Pain in other specified joint: Secondary | ICD-10-CM | POA: Diagnosis not present

## 2021-06-15 DIAGNOSIS — K219 Gastro-esophageal reflux disease without esophagitis: Secondary | ICD-10-CM | POA: Diagnosis not present

## 2021-06-15 DIAGNOSIS — I2510 Atherosclerotic heart disease of native coronary artery without angina pectoris: Secondary | ICD-10-CM | POA: Diagnosis not present

## 2021-06-15 DIAGNOSIS — Z7982 Long term (current) use of aspirin: Secondary | ICD-10-CM | POA: Diagnosis not present

## 2021-06-15 DIAGNOSIS — N183 Chronic kidney disease, stage 3 unspecified: Secondary | ICD-10-CM | POA: Diagnosis not present

## 2021-06-15 DIAGNOSIS — Z01818 Encounter for other preprocedural examination: Secondary | ICD-10-CM

## 2021-06-15 DIAGNOSIS — Z8546 Personal history of malignant neoplasm of prostate: Secondary | ICD-10-CM | POA: Diagnosis not present

## 2021-06-15 HISTORY — DX: Atherosclerotic heart disease of native coronary artery without angina pectoris: I25.10

## 2021-06-15 HISTORY — DX: Heart failure, unspecified: I50.9

## 2021-06-15 HISTORY — DX: Unspecified osteoarthritis, unspecified site: M19.90

## 2021-06-15 LAB — PROTIME-INR
INR: 1 (ref 0.8–1.2)
Prothrombin Time: 13.1 seconds (ref 11.4–15.2)

## 2021-06-15 LAB — CBC
HCT: 41.9 % (ref 39.0–52.0)
Hemoglobin: 14.3 g/dL (ref 13.0–17.0)
MCH: 31.8 pg (ref 26.0–34.0)
MCHC: 34.1 g/dL (ref 30.0–36.0)
MCV: 93.3 fL (ref 80.0–100.0)
Platelets: 160 10*3/uL (ref 150–400)
RBC: 4.49 MIL/uL (ref 4.22–5.81)
RDW: 12.9 % (ref 11.5–15.5)
WBC: 5.8 10*3/uL (ref 4.0–10.5)
nRBC: 0 % (ref 0.0–0.2)

## 2021-06-15 LAB — BASIC METABOLIC PANEL
Anion gap: 6 (ref 5–15)
BUN: 22 mg/dL (ref 8–23)
CO2: 26 mmol/L (ref 22–32)
Calcium: 9.4 mg/dL (ref 8.9–10.3)
Chloride: 105 mmol/L (ref 98–111)
Creatinine, Ser: 1.06 mg/dL (ref 0.61–1.24)
GFR, Estimated: >60 mL/min (ref >60)
Glucose, Bld: 103 mg/dL — ABNORMAL HIGH (ref 70–99)
Potassium: 4.3 mmol/L (ref 3.5–5.1)
Sodium: 137 mmol/L (ref 135–145)

## 2021-06-15 LAB — URINALYSIS, ROUTINE W REFLEX MICROSCOPIC
Bilirubin Urine: NEGATIVE
Glucose, UA: NEGATIVE mg/dL
Hgb urine dipstick: NEGATIVE
Ketones, ur: NEGATIVE mg/dL
Leukocytes,Ua: NEGATIVE
Nitrite: NEGATIVE
Protein, ur: NEGATIVE mg/dL
Specific Gravity, Urine: 1.006 (ref 1.005–1.030)
pH: 5 (ref 5.0–8.0)

## 2021-06-15 LAB — SURGICAL PCR SCREEN
MRSA, PCR: NEGATIVE
Staphylococcus aureus: NEGATIVE

## 2021-06-15 LAB — APTT: aPTT: 32 seconds (ref 24–36)

## 2021-06-15 NOTE — Progress Notes (Signed)
COVID Vaccine Completed: Yes Date COVID Vaccine completed: 03/20/20 x 2 COVID vaccine manufacturer: Pfizer      COVID Test: N/A. Pt. Tested + for COVID on 05/23/21 PCP - Dr. Maury Dus Cardiologist - Dr. Gwyndolyn [place]  Chest x-ray -  EKG - 06/30/20 Stress Test -  ECHO - 11/10/20 Cardiac Cath - 07/07/20 Pacemaker/ICD device last checked:  Sleep Study -  CPAP -   Fasting Blood Sugar -  Checks Blood Sugar _____ times a day  Blood Thinner Instructions: Aspirin Instructions: It has been on hold. Last Dose:  Anesthesia review: Hx: HTN,CKD III,CHF,CAD,Lt. BBB  Patient denies shortness of breath, fever, cough and chest pain at PAT appointment   Patient verbalized understanding of instructions that were given to them at the PAT appointment. Patient was also instructed that they will need to review over the PAT instructions again at home before surgery.

## 2021-06-15 NOTE — H&P (View-Only) (Signed)
Glenn Murray is an 71 y.o. male.   Chief Complaint: Left knee pain HPI: The patient is here for his H&P. He is scheduled for a left total knee replacement by Dr. Tonita Cong on 06/24/21 at [facility]  Dr. Tonita Cong and the patient mutually agreed to proceed with a total knee replacement. Risks and benefits of the procedure were discussed including stiffness, suboptimal range of motion, persistent pain, infection requiring removal of prosthesis and reinsertion, need for prophylactic antibiotics in the future, for example, dental procedures, possible need for manipulation, revision in the future and also anesthetic complications including DVT, PE, etc. We discussed the perioperative course, time in the hospital, postoperative recovery and the need for elevation to control swelling. We also discussed the predicted range of motion and the probability that squatting and kneeling would be unobtainable in the future. In addition, postoperative anticoagulation was discussed. We have obtained preoperative medical clearance as necessary. Provided illustrated handout and discussed it in detail. They will enroll in the total joint replacement educational forum at the hospital.  He has been cleared by his PCP. Back/hip pain improved s/p prednisone. Initial January surgery rescheduled due to COVID +  Past Medical History:  Diagnosis Date   ADHD    Allergic rhinitis    Anxiety    Arthritis    Benign prostatic hyperplasia without lower urinary tract symptoms    BMI 30.0-30.9,adult    Cardiac abnormality    CHF (congestive heart failure) (HCC)    CKD (chronic kidney disease), stage III (HCC)    Coronary artery disease    Dyspnea    ED (erectile dysfunction)    Edema    Foreign body in cornea    GERD (gastroesophageal reflux disease)    Hyperlipidemia    Hypertension    Pain in left knee    Prostate cancer ([place])    Vitreous degeneration     Past Surgical History:  Procedure Laterality Date   LEFT  HEART CATH AND CORONARY ANGIOGRAPHY N/A 07/07/2020   Procedure: LEFT HEART CATH AND CORONARY ANGIOGRAPHY;  Surgeon: Burnell Blanks, MD;  Location: [facility];  Service: Cardiovascular;  Laterality: N/A;   NASAL SEPTOPLASTY W/ TURBINOPLASTY Bilateral 04/20/2021   Procedure: NASAL SEPTOPLASTY WITH BILATERAL TURBINATE REDUCTION;  Surgeon: Leta Baptist, MD;  [facility];  Service: ENT;  Laterality: Bilateral;   TONSILLECTOMY      Family History  Problem Relation Age of Onset   Stroke Father    Heart attack Paternal Aunt    Social History:  reports that he has never smoked. He has never used smokeless tobacco. He reports that he does not currently use alcohol after a past usage of about 2.0 - 3.0 standard drinks per week. He reports that he does not use drugs.  Allergies:  Allergies  Allergen Reactions   Atorvastatin Swelling and Other (See Comments)    Right chest swelling    Naproxen     "knocked kidney function down"    Current medsL: alfuzosin ER 10 mg tablet,extended release 24 hr aspirin furosemide 20 mg tablet loratadine 10 mg tablet losartan 25 mg tablet metoprolol succinate ER 25 mg tablet,extended release 24 hr rosuvastatin 10 mg tablet spironolactone 25 mg tablet   Results for orders placed or performed during the hospital encounter of 06/15/21 (from the past 48 hour(s))  CBC     Status: None   Collection Time: 06/15/21 11:42 AM  Result Value Ref Range   WBC 5.8 4.0 -  10.5 K/uL   RBC 4.49 4.22 - 5.81 MIL/uL   Hemoglobin 14.3 13.0 - 17.0 g/dL   HCT 41.9 39.0 - 52.0 %   MCV 93.3 80.0 - 100.0 fL   MCH 31.8 26.0 - 34.0 pg   MCHC 34.1 30.0 - 36.0 g/dL   RDW 12.9 11.5 - 15.5 %   Platelets 160 150 - 400 K/uL   nRBC 0.0 0.0 - 0.2 %    Comment: Performed at [facility], [place] [address], [place], [place] [address]  Urinalysis, Routine w reflex microscopic     Status: Abnormal   Collection Time: 06/15/21 11:42 AM  Result Value Ref  Range   Color, Urine STRAW (A) YELLOW   APPearance CLEAR CLEAR   Specific Gravity, Urine 1.006 1.005 - 1.030   pH 5.0 5.0 - 8.0   Glucose, UA NEGATIVE NEGATIVE mg/dL   Hgb urine dipstick NEGATIVE NEGATIVE   Bilirubin Urine NEGATIVE NEGATIVE   Ketones, ur NEGATIVE NEGATIVE mg/dL   Protein, ur NEGATIVE NEGATIVE mg/dL   Nitrite NEGATIVE NEGATIVE   Leukocytes,Ua NEGATIVE NEGATIVE    Comment: Performed at [facility] [address], [place], [place] [address]   No results found.  Review of Systems  Constitutional: Negative.   HENT: Negative.    Eyes: Negative.   Respiratory: Negative.    Cardiovascular: Negative.   Gastrointestinal: Negative.   Endocrine: Negative.   Genitourinary: Negative.   Musculoskeletal:  Positive for arthralgias, gait problem, joint swelling and myalgias.  Allergic/Immunologic: Negative.    There were no vitals taken for this visit. Physical Exam Constitutional:      Appearance: Normal appearance.  HENT:     Head: Normocephalic and atraumatic.     Right Ear: External ear normal.     Left Ear: External ear normal.     Nose: Nose normal.     Mouth/Throat:     Pharynx: Oropharynx is clear.  Eyes:     Conjunctiva/sclera: Conjunctivae normal.  Cardiovascular:     Rate and Rhythm: Normal rate and regular rhythm.     Pulses: Normal pulses.     Heart sounds: Normal heart sounds.  Pulmonary:     Effort: Pulmonary effort is normal.     Breath sounds: Normal breath sounds.  Abdominal:     General: Bowel sounds are normal.  Musculoskeletal:     Cervical back: Normal range of motion.     Comments: On exam he walks an antalgic gait. Slight varus thrust. Tender medial joint line patellofemoral pain compression. Trace effusion. No DVT. Ipsilateral hip and ankle exams unremarkable   Skin:    General: Skin is warm and dry.  Neurological:     Mental Status: He is alert.     Standing xrays with end-stage medial joint space  narrowing.  Assessment/Plan Impression: L knee end-stage DJD  Pt with end-stage Left knee DJD, bone-on-bone, refractory to conservative tx, scheduled for Left total knee replacement by Dr. Tonita Cong on 06/01/21. We again discussed the procedure itself as well as risks, complications and alternatives, including but not limited to DVT, PE, infx, bleeding, failure of procedure, need for secondary procedure including manipulation, nerve injury, ongoing pain/symptoms, anesthesia risk, even stroke or death. Also discussed typical post-op protocols, activity restrictions, need for PT, flexion/extension exercises, time out of work. Discussed need for DVT ppx post-op per protocol. Discussed dental ppx and infx prevention. Also discussed limitations post-operatively such as kneeling and squatting. All questions were answered. Patient desires to  proceed with surgery as scheduled. Will hold supplements, ASA and NSAIDs accordingly. Will remain NPO after midnight the night before surgery. Will present to [facility] for pre-op testing. Anticipate hospital stay to include at least 2 midnights given medical history and to ensure proper pain control. Plan ASA BID for DVT ppx post-op. Plan Percocet, Robaxin, Colace, Miralax. Plan to D/C home post-op with family members at home for assistance, will start outpatient PT here within a week post-op. Will follow up 10-14 days post-op for staple removal and xrays.  Plan Left total knee replacement  Cecilie Kicks, PA-C for Dr Tonita Cong 06/15/2021, 1:03 PM

## 2021-06-15 NOTE — H&P (Signed)
Glenn Murray is an 71 y.o. male.   Chief Complaint: Left knee pain HPI: The patient is here for his H&P. He is scheduled for a left total knee replacement by Dr. Tonita Cong on 06/24/21 at [facility].  Dr. Tonita Cong and the patient mutually agreed to proceed with a total knee replacement. Risks and benefits of the procedure were discussed including stiffness, suboptimal range of motion, persistent pain, infection requiring removal of prosthesis and reinsertion, need for prophylactic antibiotics in the future, for example, dental procedures, possible need for manipulation, revision in the future and also anesthetic complications including DVT, PE, etc. We discussed the perioperative course, time in the hospital, postoperative recovery and the need for elevation to control swelling. We also discussed the predicted range of motion and the probability that squatting and kneeling would be unobtainable in the future. In addition, postoperative anticoagulation was discussed. We have obtained preoperative medical clearance as necessary. Provided illustrated handout and discussed it in detail. They will enroll in the total joint replacement educational forum at the hospital.  He has been cleared by his PCP. Back/hip pain improved s/p prednisone. Initial January surgery rescheduled due to COVID +  Past Medical History:  Diagnosis Date   ADHD    Allergic rhinitis    Anxiety    Arthritis    Benign prostatic hyperplasia without lower urinary tract symptoms    BMI 30.0-30.9,adult    Cardiac abnormality    CHF (congestive heart failure) (HCC)    CKD (chronic kidney disease), stage III (HCC)    Coronary artery disease    Dyspnea    ED (erectile dysfunction)    Edema    Foreign body in cornea    GERD (gastroesophageal reflux disease)    Hyperlipidemia    Hypertension    Pain in left knee    Prostate cancer ([place])    Vitreous degeneration     Past Surgical History:  Procedure Laterality Date   LEFT  HEART CATH AND CORONARY ANGIOGRAPHY N/A 07/07/2020   Procedure: LEFT HEART CATH AND CORONARY ANGIOGRAPHY;  Surgeon: Burnell Blanks, MD;  Location: [facility];  Service: Cardiovascular;  Laterality: N/A;   NASAL SEPTOPLASTY W/ TURBINOPLASTY Bilateral 04/20/2021   Procedure: NASAL SEPTOPLASTY WITH BILATERAL TURBINATE REDUCTION;  Surgeon: Leta Baptist, MD;  Location[facility];  Service: ENT;  Laterality: Bilateral;   TONSILLECTOMY      Family History  Problem Relation Age of Onset   Stroke Father    Heart attack Paternal Aunt    Social History:  reports that he has never smoked. He has never used smokeless tobacco. He reports that he does not currently use alcohol after a past usage of about 2.0 - 3.0 standard drinks per week. He reports that he does not use drugs.  Allergies:  Allergies  Allergen Reactions   Atorvastatin Swelling and Other (See Comments)    Right chest swelling    Naproxen     "knocked kidney function down"    Current medsL: alfuzosin ER 10 mg tablet,extended release 24 hr aspirin furosemide 20 mg tablet loratadine 10 mg tablet losartan 25 mg tablet metoprolol succinate ER 25 mg tablet,extended release 24 hr rosuvastatin 10 mg tablet spironolactone 25 mg tablet   Results for orders placed or performed during the hospital encounter of 06/15/21 (from the past 48 hour(s))  CBC     Status: None   Collection Time: 06/15/21 11:42 AM  Result Value Ref Range   WBC 5.8 4.0 -  10.5 K/uL   RBC 4.49 4.22 - 5.81 MIL/uL   Hemoglobin 14.3 13.0 - 17.0 g/dL   HCT 41.9 39.0 - 52.0 %   MCV 93.3 80.0 - 100.0 fL   MCH 31.8 26.0 - 34.0 pg   MCHC 34.1 30.0 - 36.0 g/dL   RDW 12.9 11.5 - 15.5 %   Platelets 160 150 - 400 K/uL   nRBC 0.0 0.0 - 0.2 %    Comment: Performed at [facility], [place] [address], [place], [place] [address]  Urinalysis, Routine w reflex microscopic     Status: Abnormal   Collection Time: 06/15/21 11:42 AM  Result Value Ref  Range   Color, Urine STRAW (A) YELLOW   APPearance CLEAR CLEAR   Specific Gravity, Urine 1.006 1.005 - 1.030   pH 5.0 5.0 - 8.0   Glucose, UA NEGATIVE NEGATIVE mg/dL   Hgb urine dipstick NEGATIVE NEGATIVE   Bilirubin Urine NEGATIVE NEGATIVE   Ketones, ur NEGATIVE NEGATIVE mg/dL   Protein, ur NEGATIVE NEGATIVE mg/dL   Nitrite NEGATIVE NEGATIVE   Leukocytes,Ua NEGATIVE NEGATIVE    Comment: Performed at [facility] [address] [place], [place] [address]   No results found.  Review of Systems  Constitutional: Negative.   HENT: Negative.    Eyes: Negative.   Respiratory: Negative.    Cardiovascular: Negative.   Gastrointestinal: Negative.   Endocrine: Negative.   Genitourinary: Negative.   Musculoskeletal:  Positive for arthralgias, gait problem, joint swelling and myalgias.  Allergic/Immunologic: Negative.    There were no vitals taken for this visit. Physical Exam Constitutional:      Appearance: Normal appearance.  HENT:     Head: Normocephalic and atraumatic.     Right Ear: External ear normal.     Left Ear: External ear normal.     Nose: Nose normal.     Mouth/Throat:     Pharynx: Oropharynx is clear.  Eyes:     Conjunctiva/sclera: Conjunctivae normal.  Cardiovascular:     Rate and Rhythm: Normal rate and regular rhythm.     Pulses: Normal pulses.     Heart sounds: Normal heart sounds.  Pulmonary:     Effort: Pulmonary effort is normal.     Breath sounds: Normal breath sounds.  Abdominal:     General: Bowel sounds are normal.  Musculoskeletal:     Cervical back: Normal range of motion.     Comments: On exam he walks an antalgic gait. Slight varus thrust. Tender medial joint line patellofemoral pain compression. Trace effusion. No DVT. Ipsilateral hip and ankle exams unremarkable   Skin:    General: Skin is warm and dry.  Neurological:     Mental Status: He is alert.     Standing xrays with end-stage medial joint space  narrowing.  Assessment/Plan Impression: L knee end-stage DJD  Pt with end-stage Left knee DJD, bone-on-bone, refractory to conservative tx, scheduled for Left total knee replacement by Dr. Tonita Cong on 06/01/21. We again discussed the procedure itself as well as risks, complications and alternatives, including but not limited to DVT, PE, infx, bleeding, failure of procedure, need for secondary procedure including manipulation, nerve injury, ongoing pain/symptoms, anesthesia risk, even stroke or death. Also discussed typical post-op protocols, activity restrictions, need for PT, flexion/extension exercises, time out of work. Discussed need for DVT ppx post-op per protocol. Discussed dental ppx and infx prevention. Also discussed limitations post-operatively such as kneeling and squatting. All questions were answered. Patient desires to  proceed with surgery as scheduled. Will hold supplements, ASA and NSAIDs accordingly. Will remain NPO after midnight the night before surgery. Will present to [facility] for pre-op testing. Anticipate hospital stay to include at least 2 midnights given medical history and to ensure proper pain control. Plan ASA BID for DVT ppx post-op. Plan Percocet, Robaxin, Colace, Miralax. Plan to D/C home post-op with family members at home for assistance, will start outpatient PT here within a week post-op. Will follow up 10-14 days post-op for staple removal and xrays.  Plan Left total knee replacement  Cecilie Kicks, PA-C for Dr Tonita Cong 06/15/2021, 1:03 PM

## 2021-06-16 ENCOUNTER — Encounter: Payer: Self-pay | Admitting: Cardiology

## 2021-06-16 ENCOUNTER — Ambulatory Visit (INDEPENDENT_AMBULATORY_CARE_PROVIDER_SITE_OTHER): Payer: Medicare Other | Admitting: Cardiology

## 2021-06-16 VITALS — BP 136/74 | HR 67 | Ht 69.0 in | Wt 227.0 lb

## 2021-06-16 DIAGNOSIS — E785 Hyperlipidemia, unspecified: Secondary | ICD-10-CM | POA: Diagnosis not present

## 2021-06-16 DIAGNOSIS — R42 Dizziness and giddiness: Secondary | ICD-10-CM | POA: Diagnosis not present

## 2021-06-16 DIAGNOSIS — I493 Ventricular premature depolarization: Secondary | ICD-10-CM | POA: Diagnosis not present

## 2021-06-16 DIAGNOSIS — Z79899 Other long term (current) drug therapy: Secondary | ICD-10-CM

## 2021-06-16 DIAGNOSIS — Z01818 Encounter for other preprocedural examination: Secondary | ICD-10-CM

## 2021-06-16 DIAGNOSIS — I447 Left bundle-branch block, unspecified: Secondary | ICD-10-CM

## 2021-06-16 DIAGNOSIS — E782 Mixed hyperlipidemia: Secondary | ICD-10-CM

## 2021-06-16 DIAGNOSIS — Z01810 Encounter for preprocedural cardiovascular examination: Secondary | ICD-10-CM | POA: Diagnosis not present

## 2021-06-16 DIAGNOSIS — I2510 Atherosclerotic heart disease of native coronary artery without angina pectoris: Secondary | ICD-10-CM | POA: Diagnosis not present

## 2021-06-16 NOTE — Patient Instructions (Signed)
Medication Instructions:   Your physician recommends that you continue on your current medications as directed. Please refer to the Current Medication list given to you today.  *If you need a refill on your cardiac medications before your next appointment, please call your pharmacy*   Lab Work:  IN 3 MONTHS, SAME DAY AS YOUR 3 MONTH FOLLOW-UP APPOINTMENT WITH OUR OFFICE--WILL CHECK LIPIDS AT THAT TIME  If you have labs (blood work) drawn today and your tests are completely normal, you will receive your results only by: [place] (if you have MyChart) OR A paper copy in the mail If you have any lab test that is abnormal or we need to change your treatment, we will call you to review the results.   Follow-Up:  3 MONTHS IN THE OFFICE WITH DR. Johney Frame OR AN EXTENDER--PLEASE HAVE YOUR LIPIDS CHECKED SAME DAY AS THIS APPOINTMENT    :1}  Other Instructions  PER DR. Johney Frame, YOU MAY RESUME BACK TAKING YOUR ASPIRIN AFTER YOUR SURGERY

## 2021-06-16 NOTE — Progress Notes (Signed)
Anesthesia Chart Review   Case: [id number] Date/Time: 06/24/21 1300   Procedure: TOTAL KNEE ARTHROPLASTY (Left: Knee)   Anesthesia type: Spinal   Pre-op diagnosis: Degenerative joint disease left knee   Location: [facility] ROOM 07 / [facility]   Surgeons: Susa Day, MD       DISCUSSION:70 y.o. never smoker with h/o GERD, HTN, CAD, CHF, prostate cancer, CKD Stage III, left knee djd scheduled for above procedure 06/24/21 with Dr. Susa Day.   Pt seen by cardiology 06/16/2021. Per OV note, "Patient is planned for knee surgery. He is active and able to perform >4METs without issues. RCRI 10.1% for MACE given known CAD and mild HFrEF. No further cardiac testing needed at this time. Recommend resuming ASA once able post-surgery. -Okay to hold ASA for procedure; resume once able -No further cardiac testing needed prior to surgery"  Anticipate pt can proceed with planned procedure barring acute status change.   VS: BP 104/64    Pulse 70    Temp 36.5 C (Oral)    Ht 5\' 9"  (1.753 m)    Wt 102.1 kg    SpO2 98%    BMI 33.23 kg/m   PROVIDERS: Maury Dus, MD is PCP   Gwyndolyn [place], MD is Cardiologist  LABS: Labs reviewed: Acceptable for surgery. (all labs ordered are listed, but only abnormal results are displayed)  Labs Reviewed  BASIC METABOLIC PANEL - Abnormal; Notable for the following components:      Result Value   Glucose, Bld 103 (*)    All other components within normal limits  URINALYSIS, ROUTINE W REFLEX MICROSCOPIC - Abnormal; Notable for the following components:   Color, Urine STRAW (*)    All other components within normal limits  SURGICAL PCR SCREEN  CBC  PROTIME-INR  APTT     IMAGES:   EKG: 06/30/2020 Rate 69 bpm  NSR LBBB  CV: Echo 11/10/20 1. Left ventricular ejection fraction, by estimation, is 40 to 45%. Left  ventricular ejection fraction by 3D volume is 45 %. The left ventricle has  mildly decreased function. The left ventricle demonstrates regional  wall  motion abnormalities. Inferior  hypokinesis. Abnormal (paradoxical) septal motion, consistent with left  bundle branch block. Left ventricular diastolic parameters are consistent  with Grade I diastolic dysfunction (impaired relaxation).   2. Right ventricular systolic function is normal. The right ventricular  size is mildly enlarged. Tricuspid regurgitation signal is inadequate for  assessing PA pressure.   3. The mitral valve is normal in structure. No evidence of mitral valve  regurgitation. No evidence of mitral stenosis.   4. The aortic valve is tricuspid. Aortic valve regurgitation is not  visualized. Mild aortic valve sclerosis is present, with no evidence of  aortic valve stenosis.   5. Aortic dilatation noted. There is borderline dilatation of the  ascending aorta, measuring 38 mm.   6. The inferior vena cava is normal in size with greater than 50%  respiratory variability, suggesting right atrial pressure of 3 mmHg.   Cardiac Cath 07/07/2020 Prox RCA to Mid RCA lesion is 100% stenosed.   1. No obstructive disease in the LAD or Circumflex 2. Chronic occlusion of the heavily calcified mid RCA with long segment occlusion. The mid and distal RCA fills from left to right collaterals. The vessel is not favorable for CTO PCI given calcification, side branches and long segment of occlusion.    Recommendations: Reviewed with CTO team Dr. Irish Lack. RCA lesion appears chronic and is not favorable  for CTO PCI. Medical management of CAD and LV dysfunction.  Past Medical History:  Diagnosis Date   ADHD    Allergic rhinitis    Anxiety    Arthritis    Benign prostatic hyperplasia without lower urinary tract symptoms    BMI 30.0-30.9,adult    Cardiac abnormality    CHF (congestive heart failure) (HCC)    CKD (chronic kidney disease), stage III (HCC)    Coronary artery disease    Dyspnea    ED (erectile dysfunction)    Edema    Foreign body in cornea    GERD (gastroesophageal  reflux disease)    Hyperlipidemia    Hypertension    Pain in left knee    Prostate cancer ([place])    Vitreous degeneration     Past Surgical History:  Procedure Laterality Date   LEFT HEART CATH AND CORONARY ANGIOGRAPHY N/A 07/07/2020   Procedure: LEFT HEART CATH AND CORONARY ANGIOGRAPHY;  Surgeon: Burnell Blanks, MD;  Location: [facility];  Service: Cardiovascular;  Laterality: N/A;   NASAL SEPTOPLASTY W/ TURBINOPLASTY Bilateral 04/20/2021   Procedure: NASAL SEPTOPLASTY WITH BILATERAL TURBINATE REDUCTION;  Surgeon: Leta Baptist, MD;  Location: [facility];  Service: ENT;  Laterality: Bilateral;   TONSILLECTOMY      MEDICATIONS:  acetaminophen (TYLENOL) 325 MG tablet   alfuzosin (UROXATRAL) 10 MG 24 hr tablet   aspirin EC 81 MG tablet   clonazePAM (KLONOPIN) 0.5 MG tablet   Coenzyme Q10 (CO Q-10 PO)   ezetimibe (ZETIA) 10 MG tablet   furosemide (LASIX) 20 MG tablet   loratadine (CLARITIN) 10 MG tablet   losartan (COZAAR) 25 MG tablet   Magnesium 250 MG TABS   metoprolol succinate (TOPROL XL) 25 MG 24 hr tablet   Multiple Vitamin (MULTIVITAMIN WITH MINERALS) TABS tablet   rosuvastatin (CRESTOR) 10 MG tablet   spironolactone (ALDACTONE) 25 MG tablet   Turmeric 500 MG CAPS   No current facility-administered medications for this encounter.     Konrad Felix Ward, PA-C [facility] Pre-Surgical Testing [phone]

## 2021-06-21 ENCOUNTER — Other Ambulatory Visit: Payer: Self-pay

## 2021-06-21 MED ORDER — METOPROLOL SUCCINATE ER 25 MG PO TB24: 25.0000 mg | ORAL_TABLET | Freq: Every day | ORAL | 3 refills | Status: DC

## 2021-06-24 ENCOUNTER — Ambulatory Visit (HOSPITAL_COMMUNITY)
Admission: RE | Admit: 2021-06-24 | Discharge: 2021-06-25 | Disposition: A | Discharge status: Home / Self Care | Payer: Medicare Other | Attending: Specialist | Admitting: Specialist

## 2021-06-24 ENCOUNTER — Ambulatory Visit (HOSPITAL_COMMUNITY): Payer: Medicare Other

## 2021-06-24 ENCOUNTER — Encounter (HOSPITAL_COMMUNITY)
Admission: RE | Disposition: A | Discharge status: Home / Self Care | Payer: Self-pay | Source: Home / Self Care | Attending: Specialist

## 2021-06-24 ENCOUNTER — Ambulatory Visit (HOSPITAL_COMMUNITY): Payer: Medicare Other | Admitting: Certified Registered Nurse Anesthetist

## 2021-06-24 ENCOUNTER — Ambulatory Visit (HOSPITAL_COMMUNITY): Payer: Medicare Other | Admitting: Physician Assistant

## 2021-06-24 ENCOUNTER — Encounter (HOSPITAL_COMMUNITY): Payer: Self-pay | Admitting: Specialist

## 2021-06-24 ENCOUNTER — Other Ambulatory Visit: Payer: Self-pay

## 2021-06-24 DIAGNOSIS — M1712 Unilateral primary osteoarthritis, left knee: Secondary | ICD-10-CM | POA: Diagnosis present

## 2021-06-24 DIAGNOSIS — Z471 Aftercare following joint replacement surgery: Secondary | ICD-10-CM | POA: Diagnosis not present

## 2021-06-24 DIAGNOSIS — Z01818 Encounter for other preprocedural examination: Secondary | ICD-10-CM

## 2021-06-24 DIAGNOSIS — I2510 Atherosclerotic heart disease of native coronary artery without angina pectoris: Secondary | ICD-10-CM | POA: Insufficient documentation

## 2021-06-24 DIAGNOSIS — R0602 Shortness of breath: Secondary | ICD-10-CM | POA: Diagnosis not present

## 2021-06-24 DIAGNOSIS — Z6833 Body mass index (BMI) 33.0-33.9, adult: Secondary | ICD-10-CM | POA: Diagnosis not present

## 2021-06-24 DIAGNOSIS — I130 Hypertensive heart and chronic kidney disease with heart failure and stage 1 through stage 4 chronic kidney disease, or unspecified chronic kidney disease: Secondary | ICD-10-CM | POA: Diagnosis not present

## 2021-06-24 DIAGNOSIS — N183 Chronic kidney disease, stage 3 unspecified: Secondary | ICD-10-CM | POA: Diagnosis not present

## 2021-06-24 DIAGNOSIS — M21162 Varus deformity, not elsewhere classified, left knee: Secondary | ICD-10-CM | POA: Diagnosis not present

## 2021-06-24 DIAGNOSIS — G8918 Other acute postprocedural pain: Secondary | ICD-10-CM | POA: Diagnosis not present

## 2021-06-24 DIAGNOSIS — Z96642 Presence of left artificial hip joint: Secondary | ICD-10-CM | POA: Diagnosis not present

## 2021-06-24 DIAGNOSIS — E669 Obesity, unspecified: Secondary | ICD-10-CM | POA: Diagnosis not present

## 2021-06-24 DIAGNOSIS — I509 Heart failure, unspecified: Secondary | ICD-10-CM | POA: Insufficient documentation

## 2021-06-24 DIAGNOSIS — N189 Chronic kidney disease, unspecified: Secondary | ICD-10-CM | POA: Diagnosis not present

## 2021-06-24 DIAGNOSIS — Z96652 Presence of left artificial knee joint: Secondary | ICD-10-CM | POA: Diagnosis not present

## 2021-06-24 DIAGNOSIS — M25762 Osteophyte, left knee: Secondary | ICD-10-CM | POA: Diagnosis not present

## 2021-06-24 DIAGNOSIS — Z96659 Presence of unspecified artificial knee joint: Secondary | ICD-10-CM

## 2021-06-24 HISTORY — PX: TOTAL KNEE ARTHROPLASTY: SHX125

## 2021-06-24 SURGERY — ARTHROPLASTY, KNEE, TOTAL
Anesthesia: Spinal | Site: Knee | Laterality: Left

## 2021-06-24 MED ORDER — ONDANSETRON HCL 4 MG PO TABS: 4.0000 mg | ORAL_TABLET | Freq: Four times a day (QID) | ORAL | Status: DC | PRN

## 2021-06-24 MED ORDER — ADULT MULTIVITAMIN W/MINERALS CH
1.0000 | ORAL_TABLET | Freq: Every day | ORAL | Status: DC
  Administered 2021-06-24 – 2021-06-25 (×2): 1 via ORAL
  Filled 2021-06-24 (×2): qty 1

## 2021-06-24 MED ORDER — HYDROMORPHONE HCL 1 MG/ML IJ SOLN: 0.2500 mg | INTRAMUSCULAR | Status: DC | PRN

## 2021-06-24 MED ORDER — MENTHOL 3 MG MT LOZG: 1.0000 | LOZENGE | OROMUCOSAL | Status: DC | PRN

## 2021-06-24 MED ORDER — ACETAMINOPHEN 325 MG PO TABS: 325.0000 mg | ORAL_TABLET | Freq: Four times a day (QID) | ORAL | Status: DC | PRN

## 2021-06-24 MED ORDER — PHENYLEPHRINE HCL (PRESSORS) 10 MG/ML IV SOLN
INTRAVENOUS | Status: AC
  Filled 2021-06-24: qty 1

## 2021-06-24 MED ORDER — DOCUSATE SODIUM 100 MG PO CAPS
100.0000 mg | ORAL_CAPSULE | Freq: Two times a day (BID) | ORAL | Status: DC
  Administered 2021-06-24 – 2021-06-25 (×2): 100 mg via ORAL
  Filled 2021-06-24 (×2): qty 1

## 2021-06-24 MED ORDER — PHENYLEPHRINE HCL-NACL 20-0.9 MG/250ML-% IV SOLN
INTRAVENOUS | Status: DC | PRN
Start: 2021-06-24 — End: 2021-06-24
  Administered 2021-06-24: 40 ug/min via INTRAVENOUS

## 2021-06-24 MED ORDER — ACETAMINOPHEN 10 MG/ML IV SOLN
1000.0000 mg | INTRAVENOUS | Status: AC
  Administered 2021-06-24: 1000 mg via INTRAVENOUS
  Filled 2021-06-24: qty 100

## 2021-06-24 MED ORDER — BISACODYL 5 MG PO TBEC: 5.0000 mg | DELAYED_RELEASE_TABLET | Freq: Every day | ORAL | Status: DC | PRN

## 2021-06-24 MED ORDER — LORATADINE 10 MG PO TABS
10.0000 mg | ORAL_TABLET | Freq: Every day | ORAL | Status: DC
  Administered 2021-06-25: 10 mg via ORAL
  Filled 2021-06-24: qty 1

## 2021-06-24 MED ORDER — FENTANYL CITRATE PF 50 MCG/ML IJ SOSY
50.0000 ug | PREFILLED_SYRINGE | INTRAMUSCULAR | Status: DC
  Administered 2021-06-24: 100 ug via INTRAVENOUS
  Filled 2021-06-24: qty 2

## 2021-06-24 MED ORDER — PROPOFOL 10 MG/ML IV BOLUS
INTRAVENOUS | Status: DC | PRN
Start: 2021-06-24 — End: 2021-06-24
  Administered 2021-06-24: 20 mg via INTRAVENOUS
  Administered 2021-06-24: 10 mg via INTRAVENOUS
  Administered 2021-06-24: 20 mg via INTRAVENOUS

## 2021-06-24 MED ORDER — MIDAZOLAM HCL 2 MG/2ML IJ SOLN
INTRAMUSCULAR | Status: DC | PRN
  Administered 2021-06-24: 1 mg via INTRAVENOUS

## 2021-06-24 MED ORDER — PROMETHAZINE HCL 25 MG/ML IJ SOLN: 6.2500 mg | INTRAMUSCULAR | Status: DC | PRN

## 2021-06-24 MED ORDER — POLYETHYLENE GLYCOL 3350 17 G PO PACK: 17.0000 g | PACK | Freq: Every day | ORAL | 0 refills | Status: DC

## 2021-06-24 MED ORDER — PROPOFOL 1000 MG/100ML IV EMUL
INTRAVENOUS | Status: AC
  Filled 2021-06-24: qty 100

## 2021-06-24 MED ORDER — METOCLOPRAMIDE HCL 5 MG PO TABS: 5.0000 mg | ORAL_TABLET | Freq: Three times a day (TID) | ORAL | Status: DC | PRN

## 2021-06-24 MED ORDER — BUPIVACAINE IN DEXTROSE 0.75-8.25 % IT SOLN
INTRATHECAL | Status: DC | PRN
  Administered 2021-06-24: 2 mL via INTRATHECAL

## 2021-06-24 MED ORDER — SODIUM CHLORIDE 0.9 % IR SOLN
Status: DC | PRN
  Administered 2021-06-24 (×2): 1000 mL

## 2021-06-24 MED ORDER — PHENOL 1.4 % MT LIQD: 1.0000 | OROMUCOSAL | Status: DC | PRN

## 2021-06-24 MED ORDER — METOCLOPRAMIDE HCL 5 MG/ML IJ SOLN: 5.0000 mg | Freq: Three times a day (TID) | INTRAMUSCULAR | Status: DC | PRN

## 2021-06-24 MED ORDER — PROPOFOL 500 MG/50ML IV EMUL
INTRAVENOUS | Status: DC | PRN
  Administered 2021-06-24: 100 ug/kg/min via INTRAVENOUS

## 2021-06-24 MED ORDER — OXYCODONE HCL 5 MG PO TABS: 5.0000 mg | ORAL_TABLET | ORAL | 0 refills | Status: DC | PRN

## 2021-06-24 MED ORDER — KCL IN DEXTROSE-NACL 20-5-0.45 MEQ/L-%-% IV SOLN
INTRAVENOUS | Status: DC
  Filled 2021-06-24: qty 1000

## 2021-06-24 MED ORDER — METOPROLOL SUCCINATE ER 25 MG PO TB24
25.0000 mg | ORAL_TABLET | Freq: Every day | ORAL | Status: DC
  Administered 2021-06-25: 25 mg via ORAL
  Filled 2021-06-24: qty 1

## 2021-06-24 MED ORDER — BUPIVACAINE HCL (PF) 0.25 % IJ SOLN
INTRAMUSCULAR | Status: AC
  Filled 2021-06-24: qty 30

## 2021-06-24 MED ORDER — MEPERIDINE HCL 50 MG/ML IJ SOLN: 6.2500 mg | INTRAMUSCULAR | Status: DC | PRN

## 2021-06-24 MED ORDER — LOSARTAN POTASSIUM 25 MG PO TABS
25.0000 mg | ORAL_TABLET | Freq: Every day | ORAL | Status: DC
  Administered 2021-06-24 – 2021-06-25 (×2): 25 mg via ORAL
  Filled 2021-06-24 (×2): qty 1

## 2021-06-24 MED ORDER — OXYCODONE HCL 5 MG PO TABS
10.0000 mg | ORAL_TABLET | ORAL | Status: DC | PRN
  Administered 2021-06-24 – 2021-06-25 (×2): 10 mg via ORAL
  Filled 2021-06-24: qty 2

## 2021-06-24 MED ORDER — ALUM & MAG HYDROXIDE-SIMETH 200-200-20 MG/5ML PO SUSP: 30.0000 mL | ORAL | Status: DC | PRN

## 2021-06-24 MED ORDER — CO Q-10 30 MG PO CAPS: ORAL_CAPSULE | Freq: Every day | ORAL | Status: DC

## 2021-06-24 MED ORDER — STERILE WATER FOR IRRIGATION IR SOLN
Status: DC | PRN
  Administered 2021-06-24: 2000 mL

## 2021-06-24 MED ORDER — ONDANSETRON HCL 4 MG/2ML IJ SOLN
INTRAMUSCULAR | Status: DC | PRN
  Administered 2021-06-24: 4 mg via INTRAVENOUS

## 2021-06-24 MED ORDER — LACTATED RINGERS IV SOLN: INTRAVENOUS | Status: DC

## 2021-06-24 MED ORDER — MAGNESIUM OXIDE -MG SUPPLEMENT 400 (240 MG) MG PO TABS
200.0000 mg | ORAL_TABLET | Freq: Every day | ORAL | Status: DC
  Administered 2021-06-25: 200 mg via ORAL
  Filled 2021-06-24: qty 1

## 2021-06-24 MED ORDER — MIDAZOLAM HCL 2 MG/2ML IJ SOLN
1.0000 mg | INTRAMUSCULAR | Status: DC
  Filled 2021-06-24: qty 2

## 2021-06-24 MED ORDER — ONDANSETRON HCL 4 MG/2ML IJ SOLN: 4.0000 mg | Freq: Four times a day (QID) | INTRAMUSCULAR | Status: DC | PRN

## 2021-06-24 MED ORDER — ASPIRIN EC 81 MG PO TBEC: 81.0000 mg | DELAYED_RELEASE_TABLET | Freq: Two times a day (BID) | ORAL | 1 refills | Status: AC

## 2021-06-24 MED ORDER — METHOCARBAMOL 500 MG PO TABS
500.0000 mg | ORAL_TABLET | Freq: Four times a day (QID) | ORAL | Status: DC | PRN
  Administered 2021-06-24 – 2021-06-25 (×3): 500 mg via ORAL
  Filled 2021-06-24 (×3): qty 1

## 2021-06-24 MED ORDER — MIDAZOLAM HCL 2 MG/2ML IJ SOLN
INTRAMUSCULAR | Status: AC
  Filled 2021-06-24: qty 2

## 2021-06-24 MED ORDER — SPIRONOLACTONE 12.5 MG HALF TABLET
12.5000 mg | ORAL_TABLET | Freq: Every day | ORAL | Status: DC
  Administered 2021-06-24 – 2021-06-25 (×2): 12.5 mg via ORAL
  Filled 2021-06-24 (×2): qty 1

## 2021-06-24 MED ORDER — METHOCARBAMOL 500 MG PO TABS: 500.0000 mg | ORAL_TABLET | Freq: Three times a day (TID) | ORAL | 1 refills | Status: DC | PRN

## 2021-06-24 MED ORDER — DEXAMETHASONE SODIUM PHOSPHATE 10 MG/ML IJ SOLN
INTRAMUSCULAR | Status: DC | PRN
Start: 2021-06-24 — End: 2021-06-24
  Administered 2021-06-24: 4 mg via INTRAVENOUS

## 2021-06-24 MED ORDER — ACETAMINOPHEN 500 MG PO TABS: 1000.0000 mg | ORAL_TABLET | Freq: Once | ORAL | Status: DC

## 2021-06-24 MED ORDER — ASPIRIN 81 MG PO CHEW
81.0000 mg | CHEWABLE_TABLET | Freq: Two times a day (BID) | ORAL | Status: DC
  Administered 2021-06-25: 81 mg via ORAL
  Filled 2021-06-24: qty 1

## 2021-06-24 MED ORDER — DEXAMETHASONE SODIUM PHOSPHATE 4 MG/ML IJ SOLN
INTRAMUSCULAR | Status: DC | PRN
Start: 2021-06-24 — End: 2021-06-24
  Administered 2021-06-24: 5 mg via PERINEURAL

## 2021-06-24 MED ORDER — SODIUM CHLORIDE (PF) 0.9 % IJ SOLN
INTRAMUSCULAR | Status: AC
  Filled 2021-06-24: qty 40

## 2021-06-24 MED ORDER — RISAQUAD PO CAPS
1.0000 | ORAL_CAPSULE | Freq: Every day | ORAL | Status: DC
  Administered 2021-06-24 – 2021-06-25 (×2): 1 via ORAL
  Filled 2021-06-24 (×2): qty 1

## 2021-06-24 MED ORDER — BUPIVACAINE LIPOSOME 1.3 % IJ SUSP
INTRAMUSCULAR | Status: AC
  Filled 2021-06-24: qty 20

## 2021-06-24 MED ORDER — TRANEXAMIC ACID-NACL 1000-0.7 MG/100ML-% IV SOLN
1000.0000 mg | INTRAVENOUS | Status: AC
  Administered 2021-06-24: 1000 mg via INTRAVENOUS
  Filled 2021-06-24: qty 100

## 2021-06-24 MED ORDER — ACETAMINOPHEN 500 MG PO TABS
1000.0000 mg | ORAL_TABLET | Freq: Four times a day (QID) | ORAL | Status: DC
  Administered 2021-06-24 – 2021-06-25 (×3): 1000 mg via ORAL
  Filled 2021-06-24 (×3): qty 2

## 2021-06-24 MED ORDER — CLONIDINE HCL (ANALGESIA) 100 MCG/ML EP SOLN
EPIDURAL | Status: DC | PRN
  Administered 2021-06-24: 80 ug

## 2021-06-24 MED ORDER — CEFAZOLIN SODIUM-DEXTROSE 2-4 GM/100ML-% IV SOLN
2.0000 g | INTRAVENOUS | Status: AC
  Administered 2021-06-24: 2 g via INTRAVENOUS
  Filled 2021-06-24: qty 100

## 2021-06-24 MED ORDER — CEFAZOLIN SODIUM-DEXTROSE 2-4 GM/100ML-% IV SOLN
2.0000 g | Freq: Four times a day (QID) | INTRAVENOUS | Status: AC
  Administered 2021-06-24 – 2021-06-25 (×2): 2 g via INTRAVENOUS
  Filled 2021-06-24 (×2): qty 100

## 2021-06-24 MED ORDER — ALFUZOSIN HCL ER 10 MG PO TB24
10.0000 mg | ORAL_TABLET | Freq: Every day | ORAL | Status: DC
  Administered 2021-06-24: 10 mg via ORAL
  Filled 2021-06-24: qty 1

## 2021-06-24 MED ORDER — CHLORHEXIDINE GLUCONATE 0.12 % MT SOLN
15.0000 mL | Freq: Once | OROMUCOSAL | Status: AC
  Administered 2021-06-24: 15 mL via OROMUCOSAL

## 2021-06-24 MED ORDER — DIPHENHYDRAMINE HCL 12.5 MG/5ML PO ELIX: 12.5000 mg | ORAL_SOLUTION | ORAL | Status: DC | PRN

## 2021-06-24 MED ORDER — 0.9 % SODIUM CHLORIDE (POUR BTL) OPTIME
TOPICAL | Status: DC | PRN
  Administered 2021-06-24: 1000 mL

## 2021-06-24 MED ORDER — FUROSEMIDE 20 MG PO TABS: 20.0000 mg | ORAL_TABLET | Freq: Every day | ORAL | Status: DC | PRN

## 2021-06-24 MED ORDER — HYDROMORPHONE HCL 1 MG/ML IJ SOLN: 0.5000 mg | INTRAMUSCULAR | Status: DC | PRN

## 2021-06-24 MED ORDER — BUPIVACAINE-EPINEPHRINE 0.25% -1:200000 IJ SOLN
INTRAMUSCULAR | Status: DC | PRN
  Administered 2021-06-24: 30 mL

## 2021-06-24 MED ORDER — METHOCARBAMOL 500 MG IVPB - SIMPLE MED
500.0000 mg | Freq: Four times a day (QID) | INTRAVENOUS | Status: DC | PRN
  Filled 2021-06-24: qty 50

## 2021-06-24 MED ORDER — ORAL CARE MOUTH RINSE: 15.0000 mL | Freq: Once | OROMUCOSAL | Status: AC

## 2021-06-24 MED ORDER — OXYCODONE HCL 5 MG PO TABS
5.0000 mg | ORAL_TABLET | ORAL | Status: DC | PRN
  Administered 2021-06-24: 10 mg via ORAL
  Administered 2021-06-25: 5 mg via ORAL
  Filled 2021-06-24 (×2): qty 2
  Filled 2021-06-24: qty 1

## 2021-06-24 MED ORDER — DOCUSATE SODIUM 100 MG PO CAPS: 100.0000 mg | ORAL_CAPSULE | Freq: Two times a day (BID) | ORAL | 1 refills | Status: DC | PRN

## 2021-06-24 MED ORDER — PHENYLEPHRINE 40 MCG/ML (10ML) SYRINGE FOR IV PUSH (FOR BLOOD PRESSURE SUPPORT)
PREFILLED_SYRINGE | INTRAVENOUS | Status: AC
  Filled 2021-06-24: qty 10

## 2021-06-24 MED ORDER — EZETIMIBE 10 MG PO TABS
10.0000 mg | ORAL_TABLET | Freq: Every day | ORAL | Status: DC
  Administered 2021-06-24 – 2021-06-25 (×2): 10 mg via ORAL
  Filled 2021-06-24 (×2): qty 1

## 2021-06-24 MED ORDER — POLYETHYLENE GLYCOL 3350 17 G PO PACK
17.0000 g | PACK | Freq: Every day | ORAL | Status: DC | PRN
  Administered 2021-06-25: 17 g via ORAL
  Filled 2021-06-24: qty 1

## 2021-06-24 MED ORDER — CLONAZEPAM 0.5 MG PO TABS: 0.5000 mg | ORAL_TABLET | Freq: Two times a day (BID) | ORAL | Status: DC | PRN

## 2021-06-24 MED ORDER — SODIUM CHLORIDE 0.9% FLUSH
INTRAVENOUS | Status: DC | PRN
  Administered 2021-06-24: 40 mL

## 2021-06-24 MED ORDER — ROPIVACAINE HCL 7.5 MG/ML IJ SOLN
INTRAMUSCULAR | Status: DC | PRN
  Administered 2021-06-24: 20 mL via PERINEURAL

## 2021-06-24 SURGICAL SUPPLY — 87 items
ADH SKN CLS APL DERMABOND .7 (GAUZE/BANDAGES/DRESSINGS) ×1
AGENT HMST SPONGE THK3/8 (HEMOSTASIS)
ATTUNE MED DOME PAT 41 KNEE (Knees) ×1 IMPLANT
ATTUNE PS FEM LT SZ 8 CEM KNEE (Femur) ×1 IMPLANT
ATTUNE PSRP INSR SZ8 6 KNEE (Insert) ×1 IMPLANT
BAG COUNTER SPONGE SURGICOUNT (BAG) ×1 IMPLANT
BAG DECANTER FOR FLEXI CONT (MISCELLANEOUS) ×3 IMPLANT
BAG SPEC THK2 15X12 ZIP CLS (MISCELLANEOUS) ×1
BAG SPNG CNTER NS LX DISP (BAG) ×1
BAG ZIPLOCK 12X15 (MISCELLANEOUS) ×1 IMPLANT
BASE TIBIAL ROT PLAT SZ 8 KNEE (Knees) IMPLANT
BLADE SAW SGTL 11.0X1.19X90.0M (BLADE) ×3 IMPLANT
BLADE SAW SGTL 13.0X1.19X90.0M (BLADE) ×3 IMPLANT
BLADE SURG SZ10 CARB STEEL (BLADE) ×6 IMPLANT
BNDG COHESIVE 4X5 TAN ST LF (GAUZE/BANDAGES/DRESSINGS) ×3 IMPLANT
BNDG ELASTIC 4X5.8 VLCR STR LF (GAUZE/BANDAGES/DRESSINGS) ×3 IMPLANT
BNDG ELASTIC 6X5.8 VLCR STR LF (GAUZE/BANDAGES/DRESSINGS) ×3 IMPLANT
BOWL SMART MIX CTS (DISPOSABLE) ×3 IMPLANT
BSPLAT TIB 8 CMNT ROT PLAT STR (Knees) ×1 IMPLANT
CEMENT HV SMART SET (Cement) ×5 IMPLANT
CLSR STERI-STRIP ANTIMIC 1/2X4 (GAUZE/BANDAGES/DRESSINGS) ×1 IMPLANT
COVER SURGICAL LIGHT HANDLE (MISCELLANEOUS) ×3 IMPLANT
CUFF TOURN SGL QUICK 34 (TOURNIQUET CUFF) ×2
CUFF TRNQT CYL 34X4.125X (TOURNIQUET CUFF) ×2 IMPLANT
DERMABOND ADVANCED (GAUZE/BANDAGES/DRESSINGS) ×1
DERMABOND ADVANCED .7 DNX12 (GAUZE/BANDAGES/DRESSINGS) IMPLANT
DRAPE INCISE IOBAN 66X45 STRL (DRAPES) ×3 IMPLANT
DRAPE ORTHO SPLIT 77X108 STRL (DRAPES) ×4
DRAPE SHEET LG 3/4 BI-LAMINATE (DRAPES) ×6 IMPLANT
DRAPE SURG ORHT 6 SPLT 77X108 (DRAPES) ×4 IMPLANT
DRAPE U-SHAPE 47X51 STRL (DRAPES) ×3 IMPLANT
DRSG AQUACEL AG ADV 3.5X10 (GAUZE/BANDAGES/DRESSINGS) ×3 IMPLANT
DRSG TEGADERM 4X4.75 (GAUZE/BANDAGES/DRESSINGS) IMPLANT
DURAPREP 26ML APPLICATOR (WOUND CARE) ×3 IMPLANT
ELECT BLADE TIP CTD 4 INCH (ELECTRODE) ×3 IMPLANT
ELECT REM PT RETURN 15FT ADLT (MISCELLANEOUS) ×3 IMPLANT
EVACUATOR 1/8 PVC DRAIN (DRAIN) IMPLANT
GAUZE 4X4 16PLY ~~LOC~~+RFID DBL (SPONGE) IMPLANT
GAUZE SPONGE 2X2 8PLY STRL LF (GAUZE/BANDAGES/DRESSINGS) IMPLANT
GLOVE SRG 8 PF TXTR STRL LF DI (GLOVE) ×2 IMPLANT
GLOVE SURG POLYISO LF SZ7.5 (GLOVE) ×6 IMPLANT
GLOVE SURG POLYISO LF SZ8 (GLOVE) ×6 IMPLANT
GLOVE SURG UNDER POLY LF SZ7.5 (GLOVE) ×3 IMPLANT
GLOVE SURG UNDER POLY LF SZ8 (GLOVE) ×2
GOWN STRL REUS W/TWL XL LVL3 (GOWN DISPOSABLE) ×6 IMPLANT
HANDPIECE INTERPULSE COAX TIP (DISPOSABLE) ×2
HEMOSTAT SPONGE AVITENE ULTRA (HEMOSTASIS) IMPLANT
HOLDER FOLEY CATH W/STRAP (MISCELLANEOUS) ×1 IMPLANT
IMMOBILIZER KNEE 20 (SOFTGOODS) ×2
IMMOBILIZER KNEE 20 THIGH 36 (SOFTGOODS) ×2 IMPLANT
IMMOBILIZER KNEE 22 UNIV (SOFTGOODS) ×1 IMPLANT
JET LAVAGE IRRISEPT WOUND (IRRIGATION / IRRIGATOR) ×2
KIT TURNOVER KIT A (KITS) IMPLANT
LAVAGE JET IRRISEPT WOUND (IRRIGATION / IRRIGATOR) ×2 IMPLANT
MANIFOLD NEPTUNE II (INSTRUMENTS) ×3 IMPLANT
NDL SAFETY ECLIPSE 18X1.5 (NEEDLE) IMPLANT
NEEDLE HYPO 18GX1.5 SHARP (NEEDLE)
NS IRRIG 1000ML POUR BTL (IV SOLUTION) IMPLANT
PACK TOTAL KNEE CUSTOM (KITS) ×3 IMPLANT
PROTECTOR NERVE ULNAR (MISCELLANEOUS) ×3 IMPLANT
SAW OSC TIP CART 19.5X105X1.3 (SAW) ×3 IMPLANT
SEALER BIPOLAR AQUA 6.0 (INSTRUMENTS) ×3 IMPLANT
SET HNDPC FAN SPRY TIP SCT (DISPOSABLE) ×2 IMPLANT
SPIKE FLUID TRANSFER (MISCELLANEOUS) ×3 IMPLANT
SPONGE GAUZE 2X2 STER 10/PKG (GAUZE/BANDAGES/DRESSINGS)
SPONGE SURGIFOAM ABS GEL 100 (HEMOSTASIS) IMPLANT
SPONGE T-LAP 18X18 ~~LOC~~+RFID (SPONGE) ×9 IMPLANT
STAPLER VISISTAT (STAPLE) ×1 IMPLANT
STRIP CLOSURE SKIN 1/2X4 (GAUZE/BANDAGES/DRESSINGS) IMPLANT
SUT BONE WAX W31G (SUTURE) ×3 IMPLANT
SUT MNCRL AB 4-0 PS2 18 (SUTURE) IMPLANT
SUT STRATAFIX 0 PDS 27 VIOLET (SUTURE) ×4
SUT VIC AB 1 CT1 27 (SUTURE) ×4
SUT VIC AB 1 CT1 27XBRD ANTBC (SUTURE) ×4 IMPLANT
SUT VIC AB 1 CT1 36 (SUTURE) ×1 IMPLANT
SUT VIC AB 1 CTX 36 (SUTURE)
SUT VIC AB 1 CTX36XBRD ANBCTR (SUTURE) IMPLANT
SUT VIC AB 2-0 CT1 27 (SUTURE) ×6
SUT VIC AB 2-0 CT1 TAPERPNT 27 (SUTURE) ×6 IMPLANT
SUTURE STRATFX 0 PDS 27 VIOLET (SUTURE) ×2 IMPLANT
SYR 3ML LL SCALE MARK (SYRINGE) IMPLANT
SYR 50ML LL SCALE MARK (SYRINGE) IMPLANT
TIBIAL BASE ROT PLAT SZ 8 KNEE (Knees) ×2 IMPLANT
TRAY FOLEY MTR SLVR 16FR STAT (SET/KITS/TRAYS/PACK) ×3 IMPLANT
WATER STERILE IRR 1000ML POUR (IV SOLUTION) ×3 IMPLANT
WIPE CHG CHLORHEXIDINE 2% (PERSONAL CARE ITEMS) ×3 IMPLANT
WRAP KNEE MAXI GEL POST OP (GAUZE/BANDAGES/DRESSINGS) ×3 IMPLANT

## 2021-06-24 NOTE — Plan of Care (Signed)

## 2021-06-24 NOTE — Brief Op Note (Signed)
06/24/2021  4:07 PM  PATIENT:  Glenn Murray  71 y.o. male  PRE-OPERATIVE DIAGNOSIS:  Degenerative joint disease left knee  POST-OPERATIVE DIAGNOSIS:  Degenerative joint disease left knee  PROCEDURE:  Procedure(s): TOTAL KNEE ARTHROPLASTY (Left)  SURGEON:  Surgeon(s) and Role:    Susa Day, MD - Primary  PHYSICIAN ASSISTANT:   ASSISTANTS: [place]   ANESTHESIA:   spinal  EBL:  100 mL   BLOOD ADMINISTERED:none  DRAINS: none   LOCAL MEDICATIONS USED:  MARCAINE     SPECIMEN:  No Specimen  DISPOSITION OF SPECIMEN:  N/A  COUNTS:  YES  TOURNIQUET:   Total Tourniquet Time Documented: Thigh (Left) - 68 minutes Total: Thigh (Left) - 68 minutes   DICTATION: .Other Dictation: Dictation Number [id number]  PLAN OF CARE: Admit for overnight observation  PATIENT DISPOSITION:  PACU - hemodynamically stable.   Delay start of Pharmacological VTE agent (>24hrs) due to surgical blood loss or risk of bleeding: no

## 2021-06-24 NOTE — Anesthesia Preprocedure Evaluation (Addendum)
Anesthesia Evaluation  Patient identified by MRN, date of birth, ID band Patient awake    Reviewed: Allergy & Precautions, NPO status , Patient's Chart, lab work & pertinent test results  Airway Mallampati: II  TM Distance: >3 FB Neck ROM: Full    Dental  (+) Dental Advisory Given, Teeth Intact   Pulmonary shortness of breath,    Pulmonary exam normal breath sounds clear to auscultation       Cardiovascular hypertension, Pt. on medications and Pt. on home beta blockers + CAD and +CHF  Normal cardiovascular exam Rhythm:Regular Rate:Normal  Echo 10/2021 1. Left ventricular ejection fraction, by estimation, is 40 to 45%. Left ventricular ejection fraction by 3D volume is 45 %. The left ventricle has mildly decreased function. The left ventricle demonstrates regional wall motion abnormalities. Inferior hypokinesis. Abnormal (paradoxical) septal motion, consistent with left bundle branch block. Left ventricular diastolic parameters are consistent with Grade I diastolic dysfunction (impaired relaxation).  2. Right ventricular systolic function is normal. The right ventricular size is mildly enlarged. Tricuspid regurgitation signal is inadequate for assessing PA pressure.  3. The mitral valve is normal in structure. No evidence of mitral valve regurgitation. No evidence of mitral stenosis.  4. The aortic valve is tricuspid. Aortic valve regurgitation is not visualized. Mild aortic valve sclerosis is present, with no evidence of aortic valve stenosis.  5. Aortic dilatation noted. There is borderline dilatation of the ascending aorta, measuring 38 mm.  6. The inferior vena cava is normal in size with greater than 50% respiratory variability, suggesting right atrial pressure of 3 mmHg.    Neuro/Psych PSYCHIATRIC DISORDERS Anxiety negative neurological ROS     GI/Hepatic Neg liver ROS, GERD  ,  Endo/Other  negative endocrine ROS   Renal/GU CRFRenal disease     Musculoskeletal  (+) Arthritis ,   Abdominal (+) + obese,   Peds  Hematology negative hematology ROS (+)   Anesthesia Other Findings   Reproductive/Obstetrics                           Lab Results  Component Value Date   WBC 5.8 06/15/2021   HGB 14.3 06/15/2021   HCT 41.9 06/15/2021   MCV 93.3 06/15/2021   PLT 160 06/15/2021   Lab Results  Component Value Date   CREATININE 1.06 06/15/2021   BUN 22 06/15/2021   NA 137 06/15/2021   K 4.3 06/15/2021   CL 105 06/15/2021   CO2 26 06/15/2021    Anesthesia Physical  Anesthesia Plan  ASA: 3  Anesthesia Plan: Spinal   Post-op Pain Management: Tylenol PO (pre-op) and Regional block   Induction: Intravenous  PONV Risk Score and Plan: 2 and Dexamethasone, Ondansetron, Treatment may vary due to age or medical condition, Propofol infusion, TIVA and Midazolam  Airway Management Planned: Natural Airway  Additional Equipment: None  Intra-op Plan:   Post-operative Plan:   Informed Consent: I have reviewed the patients History and Physical, chart, labs and discussed the procedure including the risks, benefits and alternatives for the proposed anesthesia with the patient or authorized representative who has indicated his/her understanding and acceptance.     Dental advisory given  Plan Discussed with: CRNA  Anesthesia Plan Comments:       Anesthesia Quick Evaluation

## 2021-06-24 NOTE — Discharge Instructions (Signed)
Elevate leg above heart 6x a day for 60minutes each Use knee immobilizer while walking until can SLR x 10 Use knee immobilizer in bed to keep knee in extension Aquacel dressing may remain in place until follow up. May shower with aquacel dressing in place. If the dressing becomes saturated or peels off, you may remove aquacel dressing. Do not remove steri-strips if they are present. Place new dressing with gauze and tape or ACE bandage which should be kept clean and dry and changed daily.   INSTRUCTIONS AFTER JOINT REPLACEMENT   Remove items at home which could result in a fall. This includes throw rugs or furniture in walking pathways ICE to the affected joint every three hours while awake for 30 minutes at a time, for at least the first 3-5 days, and then as needed for pain and swelling.  Continue to use ice for pain and swelling. You may notice swelling that will progress down to the foot and ankle.  This is normal after surgery.  Elevate your leg when you are not up walking on it.   Continue to use the breathing machine you got in the hospital (incentive spirometer) which will help keep your temperature down.  It is common for your temperature to cycle up and down following surgery, especially at night when you are not up moving around and exerting yourself.  The breathing machine keeps your lungs expanded and your temperature down.   DIET:  As you were doing prior to hospitalization, we recommend a well-balanced diet.  DRESSING / WOUND CARE / SHOWERING  Keep the surgical dressing until follow up.  The dressing is water proof, so you can shower without any extra covering.  IF THE DRESSING FALLS OFF or the wound gets wet inside, change the dressing with sterile gauze.  Please use good hand washing techniques before changing the dressing.  Do not use any lotions or creams on the incision until instructed by your surgeon.    ACTIVITY  Increase activity slowly as tolerated, but follow the weight  bearing instructions below.   No driving for 6 weeks or until further direction given by your physician.  You cannot drive while taking narcotics.  No lifting or carrying greater than 10 lbs. until further directed by your surgeon. Avoid periods of inactivity such as sitting longer than an hour when not asleep. This helps prevent blood clots.  You may return to work once you are authorized by your doctor.     WEIGHT BEARING   Weight bearing as tolerated with assist device (walker, cane, etc) as directed, use it as long as suggested by your surgeon or therapist, typically at least 4-6 weeks.   EXERCISES  Results after joint replacement surgery are often greatly improved when you follow the exercise, range of motion and muscle strengthening exercises prescribed by your doctor. Safety measures are also important to protect the joint from further injury. Any time any of these exercises cause you to have increased pain or swelling, decrease what you are doing until you are comfortable again and then slowly increase them. If you have problems or questions, call your caregiver or physical therapist for advice.   Rehabilitation is important following a joint replacement. After just a few days of immobilization, the muscles of the leg can become weakened and shrink (atrophy).  These exercises are designed to build up the tone and strength of the thigh and leg muscles and to improve motion. Often times heat used for twenty to thirty  minutes before working out will loosen up your tissues and help with improving the range of motion but do not use heat for the first two weeks following surgery (sometimes heat can increase post-operative swelling).   These exercises can be done on a training (exercise) mat, on the floor, on a table or on a bed. Use whatever works the best and is most comfortable for you.    Use music or television while you are exercising so that the exercises are a pleasant break in your day.  This will make your life better with the exercises acting as a break in your routine that you can look forward to.   Perform all exercises about fifteen times, three times per day or as directed.  You should exercise both the operative leg and the other leg as well.  Exercises include:   Quad Sets - Tighten up the muscle on the front of the thigh (Quad) and hold for 5-10 seconds.   Straight Leg Raises - With your knee straight (if you were given a brace, keep it on), lift the leg to 60 degrees, hold for 3 seconds, and slowly lower the leg.  Perform this exercise against resistance later as your leg gets stronger.  Leg Slides: Lying on your back, slowly slide your foot toward your buttocks, bending your knee up off the floor (only go as far as is comfortable). Then slowly slide your foot back down until your leg is flat on the floor again.  Angel Wings: Lying on your back spread your legs to the side as far apart as you can without causing discomfort.  Hamstring Strength:  Lying on your back, push your heel against the floor with your leg straight by tightening up the muscles of your buttocks.  Repeat, but this time bend your knee to a comfortable angle, and push your heel against the floor.  You may put a pillow under the heel to make it more comfortable if necessary.   A rehabilitation program following joint replacement surgery can speed recovery and prevent re-injury in the future due to weakened muscles. Contact your doctor or a physical therapist for more information on knee rehabilitation.    CONSTIPATION  Constipation is defined medically as fewer than three stools per week and severe constipation as less than one stool per week.  Even if you have a regular bowel pattern at home, your normal regimen is likely to be disrupted due to multiple reasons following surgery.  Combination of anesthesia, postoperative narcotics, change in appetite and fluid intake all can affect your bowels.   YOU MUST  use at least one of the following options; they are listed in order of increasing strength to get the job done.  They are all available over the counter, and you may need to use some, POSSIBLY even all of these options:    Drink plenty of fluids (prune juice may be helpful) and high fiber foods Colace 100 mg by mouth twice a day  Senokot for constipation as directed and as needed Dulcolax (bisacodyl), take with full glass of water  Miralax (polyethylene glycol) once or twice a day as needed.  If you have tried all these things and are unable to have a bowel movement in the first 3-4 days after surgery call either your surgeon or your primary doctor.    If you experience loose stools or diarrhea, hold the medications until you stool forms back up.  If your symptoms do not get better within  1 week or if they get worse, check with your doctor.  If you experience "the worst abdominal pain ever" or develop nausea or vomiting, please contact the office immediately for further recommendations for treatment.   ITCHING:  If you experience itching with your medications, try taking only a single pain pill, or even half a pain pill at a time.  You can also use Benadryl over the counter for itching or also to help with sleep.   TED HOSE STOCKINGS:  Use stockings on both legs until for at least 2 weeks or as directed by physician office. They may be removed at night for sleeping.  MEDICATIONS:  See your medication summary on the After Visit Summary that nursing will review with you.  You may have some home medications which will be placed on hold until you complete the course of blood thinner medication.  It is important for you to complete the blood thinner medication as prescribed.  PRECAUTIONS:  If you experience chest pain or shortness of breath - call 911 immediately for transfer to the hospital emergency department.   If you develop a fever greater that 101 F, purulent drainage from wound, increased  redness or drainage from wound, foul odor from the wound/dressing, or calf pain - CONTACT YOUR SURGEON.                                                   FOLLOW-UP APPOINTMENTS:  If you do not already have a post-op appointment, please call the office for an appointment to be seen by your surgeon.  Guidelines for how soon to be seen are listed in your After Visit Summary, but are typically between 1-4 weeks after surgery.  OTHER INSTRUCTIONS:   Knee Replacement:  Do not place pillow under knee, focus on keeping the knee straight while resting. CPM instructions: 0-90 degrees, 2 hours in the morning, 2 hours in the afternoon, and 2 hours in the evening. Place foam block, curve side up under heel at all times except when in CPM or when walking.  DO NOT modify, tear, cut, or change the foam block in any way.  POST-OPERATIVE OPIOID TAPER INSTRUCTIONS: It is important to wean off of your opioid medication as soon as possible. If you do not need pain medication after your surgery it is ok to stop day one. Opioids include: Codeine, Hydrocodone(Norco, Vicodin), Oxycodone(Percocet, oxycontin) and hydromorphone amongst others.  Long term and even short term use of opiods can cause: Increased pain response Dependence Constipation Depression Respiratory depression And more.  Withdrawal symptoms can include Flu like symptoms Nausea, vomiting And more Techniques to manage these symptoms Hydrate well Eat regular healthy meals Stay active Use relaxation techniques(deep breathing, meditating, yoga) Do Not substitute Alcohol to help with tapering If you have been on opioids for less than two weeks and do not have pain than it is ok to stop all together.  Plan to wean off of opioids This plan should start within one week post op of your joint replacement. Maintain the same interval or time between taking each dose and first decrease the dose.  Cut the total daily intake of opioids by one tablet each  day Next start to increase the time between doses. The last dose that should be eliminated is the evening dose.   MAKE SURE YOU:  Understand  these instructions.  Get help right away if you are not doing well or get worse.    Thank you for letting us be a part of your medical care team.  It is a privilege we respect greatly.  We hope these instructions will help you stay on track for a fast and full recovery!      

## 2021-06-24 NOTE — Care Plan (Signed)
Ortho Bundle Case Management Note  Patient Details  Name: Glenn Murray MRN: [id number] Date of Birth: 1950/12/26                  L TKA on 06-24-21 DCP: Home with wife. DME: RW ordered through [facility] PT: [facility]   DME Arranged:  [facility] rolling DME Agency:  [employer]  HH Arranged:    [place] Agency:     Additional Comments: Please contact me with any questions of if this plan should need to change.  Marianne Sofia, RN,CCM [facility]  [phone] 06/24/2021, 12:21 PM

## 2021-06-24 NOTE — Anesthesia Procedure Notes (Signed)
Anesthesia Regional Block: Adductor canal block   Pre-Anesthetic Checklist: , timeout performed,  Correct Patient, Correct Site, Correct Laterality,  Correct Procedure, Correct Position, site marked,  Risks and benefits discussed,  Surgical consent,  Pre-op evaluation,  At surgeon's request and post-op pain management  Laterality: Lower and Left  Prep: chloraprep       Needles:  Injection technique: Single-shot  Needle Type: Stimiplex     Needle Length: 9cm  Needle Gauge: 21     Additional Needles:   Procedures:,,,, ultrasound used (permanent image in chart),,    Narrative:  Start time: 06/24/2021 12:21 PM End time: 06/24/2021 12:41 PM Injection made incrementally with aspirations every 5 mL.  Performed by: Personally  Anesthesiologist: Nolon Nations, MD  Additional Notes: BP cuff, EKG monitors applied. Sedation begun. Artery and nerve location verified with ultrasound. Anesthetic injected incrementally (70ml), slowly, and after negative aspirations under direct u/s guidance. Good fascial/perineural spread. Tolerated well.

## 2021-06-24 NOTE — Anesthesia Procedure Notes (Signed)
Procedure Name: MAC Date/Time: 06/24/2021 1:38 PM Performed by: Eben Burow, CRNA Pre-anesthesia Checklist: Patient identified, Emergency Drugs available, Suction available, Patient being monitored and Timeout performed Oxygen Delivery Method: Simple face mask Placement Confirmation: positive ETCO2

## 2021-06-24 NOTE — Progress Notes (Signed)
PHARMACIST - PHYSICIAN ORDER COMMUNICATION  CONCERNING: P&T Medication Policy on Herbal Medications  DESCRIPTION:  This patients order for: Co Q-10 has been noted.  This product(s) is classified as an herbal or natural product. Due to a lack of definitive safety studies or FDA approval, nonstandard manufacturing practices, plus the potential risk of unknown drug-drug interactions while on inpatient medications, the Pharmacy and Therapeutics Committee does not permit the use of herbal or natural products of this type within [facility].   ACTION TAKEN: The pharmacy department is unable to verify this order at this time and your patient has been informed of this safety policy. Please reevaluate patients clinical condition at discharge and address if the herbal or natural product(s) should be resumed at that time.   Lindell Spar, PharmD, BCPS 06/24/2021 6:41 PM

## 2021-06-24 NOTE — Interval H&P Note (Signed)
History and Physical Interval Note:  06/24/2021 1:39 PM  Glenn Murray  has presented today for surgery, with the diagnosis of Degenerative joint disease left knee.  The various methods of treatment have been discussed with the patient and family. After consideration of risks, benefits and other options for treatment, the patient has consented to  Procedure(s): TOTAL KNEE ARTHROPLASTY (Left) as a surgical intervention.  The patient's history has been reviewed, patient examined, no change in status, stable for surgery.  I have reviewed the patient's chart and labs.  Questions were answered to the patient's satisfaction.     Johnn Hai

## 2021-06-24 NOTE — Transfer of Care (Signed)
Immediate Anesthesia Transfer of Care Note  Patient: Glenn Murray  Procedure(s) Performed: TOTAL KNEE ARTHROPLASTY (Left: Knee)  Patient Location: PACU  Anesthesia Type:Spinal  Level of Consciousness: drowsy and patient cooperative  Airway & Oxygen Therapy: Patient Spontanous Breathing and Patient connected to face mask oxygen  Post-op Assessment: Report given to RN and Post -op Vital signs reviewed and stable  Post vital signs: Reviewed and stable  Last Vitals:  Vitals Value Taken Time  BP 117/73 06/24/21 1618  Temp    Pulse 74 06/24/21 1620  Resp 13 06/24/21 1620  SpO2 99 % 06/24/21 1620  Vitals shown include unvalidated device data.  Last Pain:  Vitals:   06/24/21 1117  TempSrc:   PainSc: 0-No pain         Complications: No notable events documented.

## 2021-06-24 NOTE — Anesthesia Procedure Notes (Addendum)
Spinal  Patient location during procedure: OR Start time: 06/24/2021 1:40 PM End time: 06/24/2021 1:46 PM Reason for block: surgical anesthesia Staffing Performed: anesthesiologist  Anesthesiologist: Nolon Nations, MD Preanesthetic Checklist Completed: patient identified, IV checked, site marked, risks and benefits discussed, surgical consent, monitors and equipment checked, pre-op evaluation and timeout performed Spinal Block Patient position: sitting Prep: DuraPrep and site prepped and draped Patient monitoring: heart rate, continuous pulse ox and blood pressure Approach: right paramedian Location: L3-4 Injection technique: single-shot Needle Needle type: Spinocan  Needle gauge: 25 G Needle length: 9 cm Additional Notes Expiration date of kit checked and confirmed. Patient tolerated procedure well, without complications.

## 2021-06-24 NOTE — Progress Notes (Signed)
AssistedDr. Lissa Hoard with left, ultrasound guided, adductor canal block. Side rails up, monitors on throughout procedure. See vital signs in flow sheet. Tolerated Procedure well.

## 2021-06-25 ENCOUNTER — Telehealth: Payer: Self-pay | Admitting: Student

## 2021-06-25 DIAGNOSIS — M21162 Varus deformity, not elsewhere classified, left knee: Secondary | ICD-10-CM | POA: Diagnosis not present

## 2021-06-25 DIAGNOSIS — M25762 Osteophyte, left knee: Secondary | ICD-10-CM | POA: Diagnosis not present

## 2021-06-25 DIAGNOSIS — N183 Chronic kidney disease, stage 3 unspecified: Secondary | ICD-10-CM | POA: Diagnosis not present

## 2021-06-25 DIAGNOSIS — I509 Heart failure, unspecified: Secondary | ICD-10-CM | POA: Diagnosis not present

## 2021-06-25 DIAGNOSIS — M1712 Unilateral primary osteoarthritis, left knee: Secondary | ICD-10-CM | POA: Diagnosis not present

## 2021-06-25 DIAGNOSIS — I130 Hypertensive heart and chronic kidney disease with heart failure and stage 1 through stage 4 chronic kidney disease, or unspecified chronic kidney disease: Secondary | ICD-10-CM | POA: Diagnosis not present

## 2021-06-25 LAB — CBC
HCT: 39.6 % (ref 39.0–52.0)
Hemoglobin: 13.7 g/dL (ref 13.0–17.0)
MCH: 31.4 pg (ref 26.0–34.0)
MCHC: 34.6 g/dL (ref 30.0–36.0)
MCV: 90.6 fL (ref 80.0–100.0)
Platelets: 173 10*3/uL (ref 150–400)
RBC: 4.37 MIL/uL (ref 4.22–5.81)
RDW: 12.5 % (ref 11.5–15.5)
WBC: 13.4 10*3/uL — ABNORMAL HIGH (ref 4.0–10.5)
nRBC: 0 % (ref 0.0–0.2)

## 2021-06-25 LAB — BASIC METABOLIC PANEL
Anion gap: 9 (ref 5–15)
BUN: 20 mg/dL (ref 8–23)
CO2: 23 mmol/L (ref 22–32)
Calcium: 8.5 mg/dL — ABNORMAL LOW (ref 8.9–10.3)
Chloride: 99 mmol/L (ref 98–111)
Creatinine, Ser: 1.01 mg/dL (ref 0.61–1.24)
GFR, Estimated: >60 mL/min (ref >60)
Glucose, Bld: 136 mg/dL — ABNORMAL HIGH (ref 70–99)
Potassium: 3.8 mmol/L (ref 3.5–5.1)
Sodium: 131 mmol/L — ABNORMAL LOW (ref 135–145)

## 2021-06-25 NOTE — Progress Notes (Signed)
Pt systolic bp did drop to 98 earlier in shift after working with physical therapy. He stated he felt "goofy" in the head and was dizzy. He states he does this at home sometimes after having Covid. Bp after recovery for several minutes in chair sitting up was 118/67. Pt reports feeling better overall after rest. Rn advised pt to be careful with quick position changes and to check his bp frequently at home. Rn also advised pt to call cardiologist to let them know bp is continuing to drop at times as well.

## 2021-06-25 NOTE — Plan of Care (Signed)
°  Problem: Activity: Goal: Ability to avoid complications of mobility impairment will improve Outcome: Progressing   Problem: Pain Management: Goal: Pain level will decrease with appropriate interventions Outcome: Progressing   Problem: Skin Integrity: Goal: Will show signs of wound healing Outcome: Progressing   

## 2021-06-25 NOTE — Progress Notes (Signed)
Physical Therapy Treatment Patient Details Name: Glenn Murray MRN: [id number] DOB: June 29, 1950 Today's Date: 06/25/2021   History of Present Illness Pt s/p L TKR and with hx of ADHD, CHF, CAD, CKD, and prostate CA    PT Comments    Pt continues motivated and progressing well with mobility.  Pt up to ambulate in hall, negotiated stairs, performed HEP with written instruction provided.  Orthostatic BP performed and provided to RN - pt reports no symptoms.  Pt and spouse with multiple questions asked and answered.  Pt eager for dc home.   Recommendations for follow up therapy are one component of a multi-disciplinary discharge planning process, led by the attending physician.  Recommendations may be updated based on patient status, additional functional criteria and insurance authorization.  Follow Up Recommendations  Follow physician's recommendations for discharge plan and follow up therapies     Assistance Recommended at Discharge Intermittent Supervision/Assistance  Patient can return home with the following A little help with walking and/or transfers;A little help with bathing/dressing/bathroom;Assistance with cooking/housework;Help with stairs or ramp for entrance   Equipment Recommendations  Rolling walker (2 wheels);BSC/3in1    Recommendations for Other Services       Precautions / Restrictions Precautions Precautions: Knee Restrictions Weight Bearing Restrictions: No LLE Weight Bearing: Weight bearing as tolerated     Mobility  Bed Mobility Overal bed mobility: Needs Assistance Bed Mobility: Supine to Sit, Sit to Supine     Supine to sit: Supervision Sit to supine: Supervision   General bed mobility comments: for safety only    Transfers Overall transfer level: Needs assistance Equipment used: Rolling walker (2 wheels) Transfers: Sit to/from Stand Sit to Stand: Supervision           General transfer comment: min cues for technique     Ambulation/Gait Ambulation/Gait assistance: Min guard, Supervision Gait Distance (Feet): 120 Feet Assistive device: Rolling walker (2 wheels) Gait Pattern/deviations: Step-to pattern, Step-through pattern, Shuffle, Trunk flexed, Decreased step length - right, Decreased step length - left Gait velocity: decr     General Gait Details: min cues for posture, position from RW and initial sequence   Stairs Stairs: Yes Stairs assistance: Min guard Stair Management: No rails, Step to pattern, Backwards, Forwards Number of Stairs: 4 General stair comments: single step twice fwd and twice bkwd with RW; cues for sequence   Wheelchair Mobility    Modified Rankin (Stroke Patients Only)       Balance Overall balance assessment: Needs assistance Sitting-balance support: No upper extremity supported, Feet supported Sitting balance-Leahy Scale: Good     Standing balance support: No upper extremity supported Standing balance-Leahy Scale: Fair                              Cognition Arousal/Alertness: Awake/alert Behavior During Therapy: WFL for tasks assessed/performed Overall Cognitive Status: Within Functional Limits for tasks assessed                                          Exercises Total Joint Exercises Ankle Circles/Pumps: AAROM, Left, 15 reps, Supine Quad Sets: AAROM, Left, 10 reps, Supine Heel Slides: AAROM, Left, 15 reps, Supine Straight Leg Raises: AAROM, AROM, Left, 15 reps, Supine Long Arc Quad: AAROM, Left, 10 reps, Seated    General Comments        Pertinent Vitals/Pain  Pain Assessment Pain Assessment: 0-10 Pain Score: 5  Pain Location: L knee Pain Descriptors / Indicators: Aching, Sore Pain Intervention(s): Limited activity within patient's tolerance, Monitored during session, Premedicated before session, Ice applied    Home Living                          Prior Function            PT Goals (current goals  can now be found in the care plan section) Acute Rehab PT Goals Patient Stated Goal: Regain IND PT Goal Formulation: With patient Time For Goal Achievement: 07/09/21 Potential to Achieve Goals: Good Progress towards PT goals: Progressing toward goals    Frequency    7X/week      PT Plan Current plan remains appropriate    Co-evaluation              AM-PAC PT "6 Clicks" Mobility   Outcome Measure  Help needed turning from your back to your side while in a flat bed without using bedrails?: A Little Help needed moving from lying on your back to sitting on the side of a flat bed without using bedrails?: A Little Help needed moving to and from a bed to a chair (including a wheelchair)?: A Little Help needed standing up from a chair using your arms (e.g., wheelchair or bedside chair)?: A Little Help needed to walk in hospital room?: A Little Help needed climbing 3-5 steps with a railing? : A Little 6 Click Score: 18    End of Session Equipment Utilized During Treatment: Gait belt Activity Tolerance: Patient tolerated treatment well Patient left: in bed;with call bell/phone within reach;with family/visitor present Nurse Communication: Mobility status PT Visit Diagnosis: Unsteadiness on feet (R26.81);Difficulty in walking, not elsewhere classified (R26.2)     Time: 9518-8416 PT Time Calculation (min) (ACUTE ONLY): 39 min  Charges:  $Gait Training: 8-22 mins $Therapeutic Exercise: 8-22 mins $Therapeutic Activity: 8-22 mins                     Debe Coder PT Acute Rehabilitation Services Pager [phone] Office [phone]    [facility] 06/25/2021, 4:39 PM

## 2021-06-25 NOTE — Progress Notes (Signed)
Pt notified cardiology of bp drop with physical therapy and a plan was developed based on previous experiences with this as well at home. This was addressed in a previous cardiology note. Pt remains stable while sitting in chair.

## 2021-06-25 NOTE — TOC Transition Note (Addendum)
Transition of Care [facility]) - CM/SW Discharge Note   Patient Details  Name: CAPRI RABEN MRN: [id number] Date of Birth: 09/18/1950  [facility] CM/SW Contact:  Ross Ludwig, LCSW Phone Number: 06/25/2021, 1:48 PM   Clinical Narrative:    CSW was informed that patient is a ortho bundle.  CSW checked with [facility], and they confirmed patient was set up for outpatient PT.  Patient has outpatient rehab set up through physician's office.  Patient's first appointment is Monday February 6 at 12pm.  CSW spoke to patient's wife and asked about equipment, and they need a rolling walker and a 3 in 1.  CSW contacted [facility], and they are able to deliver equipment before patient leaves.  CSW informed patient's wife that the appointment information is on the AVS.  CSW signing off, please reconsult for social work needs.   Final next level of care: OP Rehab Barriers to Discharge: Barriers Resolved   Patient Goals and CMS Choice Patient states their goals for this hospitalization and ongoing recovery are:: To go to outpatient rehab.      Discharge Placement  Home and going to outpatient PT.                     Discharge Plan and Services                DME Arranged: 3-N-1, Walker rolling DME Agency: [employer] Date DME Agency Contacted: 06/25/21 Time DME Agency Contacted: 36 Representative spoke with at DME Agency: [place] ([place]) Interventions     Readmission Risk Interventions No flowsheet data found.

## 2021-06-25 NOTE — Evaluation (Signed)
Physical Therapy Evaluation Patient Details Name: Glenn Murray MRN: [id number] DOB: 07/17/1950 Today's Date: 06/25/2021  History of Present Illness  Pt s/p L TKR and with hx of ADHD, CHF, CAD, CKD, and prostate CA  Clinical Impression  Pt s/p L TKR and presents with decreased L LE strength/ROM and post op pain limiting functional mobility.  Pt also c/o dizziness with OOB activity this am - BP in sitting 138/85 and after ambulating 98/71 - RN aware.  Pt should progress to dc home with family assist and reports first OP PT appt scheduled for 06/27/21.       Recommendations for follow up therapy are one component of a multi-disciplinary discharge planning process, led by the attending physician.  Recommendations may be updated based on patient status, additional functional criteria and insurance authorization.  Follow Up Recommendations Follow physician's recommendations for discharge plan and follow up therapies    Assistance Recommended at Discharge Intermittent Supervision/Assistance  Patient can return home with the following  A little help with walking and/or transfers;A little help with bathing/dressing/bathroom;Assistance with cooking/housework;Help with stairs or ramp for entrance    Equipment Recommendations Rolling walker (2 wheels);BSC/3in1  Recommendations for Other Services       Functional Status Assessment Patient has had a recent decline in their functional status and demonstrates the ability to make significant improvements in function in a reasonable and predictable amount of time.     Precautions / Restrictions Precautions Precautions: Knee Required Braces or Orthoses: Knee Immobilizer - Left Knee Immobilizer - Left: Discontinue once straight leg raise with < 10 degree lag (Pt performed IND SLR this am) Restrictions Weight Bearing Restrictions: No LLE Weight Bearing: Weight bearing as tolerated      Mobility  Bed Mobility Overal bed mobility: Needs  Assistance Bed Mobility: Supine to Sit     Supine to sit: Supervision     General bed mobility comments: for safety only    Transfers Overall transfer level: Needs assistance Equipment used: Rolling walker (2 wheels) Transfers: Sit to/from Stand Sit to Stand: Min guard           General transfer comment: Steady assist with cues for LE management and use of UEs to self assist    Ambulation/Gait Ambulation/Gait assistance: Min assist, Min guard Gait Distance (Feet): 100 Feet Assistive device: Rolling walker (2 wheels) Gait Pattern/deviations: Step-to pattern, Step-through pattern, Shuffle, Trunk flexed, Decreased step length - right, Decreased step length - left Gait velocity: decr     General Gait Details: cues for posture, position from RW and initial sequence  Stairs            Wheelchair Mobility    Modified Rankin (Stroke Patients Only)       Balance Overall balance assessment: Needs assistance Sitting-balance support: No upper extremity supported, Feet supported Sitting balance-Leahy Scale: Good     Standing balance support: Bilateral upper extremity supported Standing balance-Leahy Scale: Poor                               Pertinent Vitals/Pain Pain Assessment Pain Assessment: 0-10 Pain Score: 5  Pain Location: L knee Pain Descriptors / Indicators: Aching, Sore Pain Intervention(s): Limited activity within patient's tolerance, Monitored during session, Premedicated before session, Ice applied    Home Living Family/patient expects to be discharged to:: Private residence Living Arrangements: Spouse/significant other Available Help at Discharge: Family Type of Home: House Home Access: Stairs to enter  Entrance Stairs-Number of Steps: 1   Home Layout: One level Home Equipment: None      Prior Function Prior Level of Function : Independent/Modified Independent                     Hand Dominance         Extremity/Trunk Assessment   Upper Extremity Assessment Upper Extremity Assessment: Overall WFL for tasks assessed    Lower Extremity Assessment Lower Extremity Assessment: LLE deficits/detail LLE Deficits / Details: AAROM at knee -5 - 100    Cervical / Trunk Assessment Cervical / Trunk Assessment: Normal  Communication   Communication: No difficulties  Cognition Arousal/Alertness: Awake/alert Behavior During Therapy: WFL for tasks assessed/performed Overall Cognitive Status: Within Functional Limits for tasks assessed                                          General Comments      Exercises Total Joint Exercises Ankle Circles/Pumps: AAROM, Left, 15 reps, Supine Quad Sets: AAROM, Left, 10 reps, Supine Heel Slides: AAROM, Left, 15 reps, Supine Straight Leg Raises: AAROM, AROM, Left, 15 reps, Supine   Assessment/Plan    PT Assessment Patient needs continued PT services  PT Problem List Decreased strength;Decreased range of motion;Decreased activity tolerance;Decreased balance;Decreased mobility;Decreased knowledge of use of DME;Pain       PT Treatment Interventions DME instruction;Gait training;Stair training;Functional mobility training;Therapeutic activities;Therapeutic exercise;Patient/family education    PT Goals (Current goals can be found in the Care Plan section)  Acute Rehab PT Goals Patient Stated Goal: Regain IND PT Goal Formulation: With patient Time For Goal Achievement: 07/09/21 Potential to Achieve Goals: Good    Frequency 7X/week     Co-evaluation               AM-PAC PT "6 Clicks" Mobility  Outcome Measure Help needed turning from your back to your side while in a flat bed without using bedrails?: A Little Help needed moving from lying on your back to sitting on the side of a flat bed without using bedrails?: A Little Help needed moving to and from a bed to a chair (including a wheelchair)?: A Little Help needed standing  up from a chair using your arms (e.g., wheelchair or bedside chair)?: A Little Help needed to walk in hospital room?: A Little Help needed climbing 3-5 steps with a railing? : A Little 6 Click Score: 18    End of Session Equipment Utilized During Treatment: Gait belt Activity Tolerance: Patient tolerated treatment well;Other (comment) (orthostatic) Patient left: in chair;with call bell/phone within reach;with nursing/sitter in room Nurse Communication: Mobility status PT Visit Diagnosis: Unsteadiness on feet (R26.81);Difficulty in walking, not elsewhere classified (R26.2)    Time: 4128-7867 PT Time Calculation (min) (ACUTE ONLY): 34 min   Charges:   PT Evaluation $PT Eval Low Complexity: 1 Low PT Treatments $Therapeutic Exercise: 8-22 mins        Debe Coder PT Acute Rehabilitation Services Pager [phone] Office [phone]   Nevin Kozuch 06/25/2021, 12:21 PM

## 2021-06-25 NOTE — Telephone Encounter (Addendum)
° °  The patient called the after-hours line as he is currently admitted to [facility] but anticipating discharge following recent knee surgery. He has been having hypotension since being on pain medication and reports this was happening prior to surgery as well. In reviewing his last note with Dr. Johney Frame and given his BP readings, will plan to reduce Losartan to 12.5 mg daily.  He voiced understanding of this and I will route today's note as an FYI to Dr. Johney Frame. I encouraged him to keep checking his blood pressure readings at home as he may require further dose adjustments.  Signed, Erma Heritage, PA-C 06/25/2021, 11:35 AM

## 2021-06-25 NOTE — Plan of Care (Signed)
°  Problem: Activity: Goal: Range of joint motion will improve 06/25/2021 0751 by Elza Rafter, RN Outcome: Progressing 06/25/2021 0751 by Elza Rafter, RN Outcome: Progressing   Problem: Pain Management: Goal: Pain level will decrease with appropriate interventions 06/25/2021 0751 by Elza Rafter, RN Outcome: Progressing 06/25/2021 0751 by Elza Rafter, RN Outcome: Progressing   Problem: Skin Integrity: Goal: Will show signs of wound healing 06/25/2021 0751 by Elza Rafter, RN Outcome: Progressing 06/25/2021 0751 by Elza Rafter, RN Outcome: Progressing   Problem: Education: Goal: Knowledge of General Education information will improve Description: Including pain rating scale, medication(s)/side effects and non-pharmacologic comfort measures Outcome: Progressing

## 2021-06-25 NOTE — Progress Notes (Signed)
° °  Subjective:  Patient reports pain as mild.  Did well overnight with no specific complaints.  Did not sleep well but feels as though he would do better at home.  Denies shortness of breath or chest pain.  Objective:   VITALS:   Vitals:   06/24/21 1850 06/24/21 2042 06/25/21 0052 06/25/21 0538  BP: 138/84 115/72 125/78 117/61  Pulse: 77 70 66 66  Resp: _0 Temp: 97.7 F (36.5 C) 98 F (36.7 C) 98.5 F (36.9 C) 97.6 F (36.4 C)  TempSrc: Oral Oral Oral Oral  SpO2:   95% 96%  Weight: 103 kg     Height: _1  (1.753 m)       ABD soft Neurovascular intact Sensation intact distally Intact pulses distally Dorsiflexion/Plantar flexion intact Incision: dressing C/D/I and no drainage Compartment soft   Lab Results  Component Value Date   WBC 13.4 (H) 06/25/2021   HGB 13.7 06/25/2021   HCT 39.6 06/25/2021   MCV 90.6 06/25/2021   PLT 173 06/25/2021   BMET    Component Value Date/Time   NA 131 (L) 06/25/2021 0336   NA 138 04/07/2021 0819   K 3.8 06/25/2021 0336   CL 99 06/25/2021 0336   CO2 23 06/25/2021 0336   GLUCOSE 136 (H) 06/25/2021 0336   BUN 20 06/25/2021 0336   BUN 24 04/07/2021 0819   CREATININE 1.01 06/25/2021 0336   CALCIUM 8.5 (L) 06/25/2021 0336   EGFR 94 04/07/2021 0819   GFRNONAA >60 06/25/2021 0336     Assessment/Plan: 1 Day Post-Op   Principal Problem:   Left knee DJD   Advance diet Up with therapy -Morning labs are within normal limits.  -Will be up with therapy per postoperative protocol   -Assuming that he does well in therapy we will plan for discharge home later today.   Nicholes Stairs 06/25/2021, 8:38 AM   Geralynn Rile, MD [phone]

## 2021-06-25 NOTE — Op Note (Signed)
NAME: Glenn Murray, Glenn Murray MEDICAL RECORD NO: [id number] ACCOUNT NO: [id number] DATE OF BIRTH: Aug 29, 1950 FACILITY: [facility] LOCATION: [facility] PHYSICIAN: Johnn Hai, MD  Operative Report   DATE OF PROCEDURE: 06/24/2021  PREOPERATIVE DIAGNOSIS:  End-stage osteoarthrosis, left knee and varus deformity.  POSTOPERATIVE DIAGNOSIS:  End-stage osteoarthrosis, left knee and varus deformity.  PROCEDURE PERFORMED:  Left total knee arthroplasty utilizing DePuy Attune rotating platform 8 femur, 8 tibia, 6 mm insert, 41 patella.  ANESTHESIA:  Spinal.  ASSISTANT: Lacie Draft, PA.  HISTORY:  A 71 year old with endd-stage osteoarthrosis, left knee and varus deformity refractory to conservative treatment, indicated for replacement of the degenerated joint.  Risks and benefits discussed including bleeding, infection, damage to  neurovascular structures, no change in symptoms, worsening symptoms, DVT, PE, anesthetic complications, need for revision in the future, arthrofibrosis, etc.  TECHNIQUE:  The patient in supine position.  After induction of adequate spinal anesthesia, the left lower extremity was prepped, draped and exsanguinated in usual sterile fashion.  Thigh tourniquet inflated to 225 mmHg.  Midline incision was then made  over the knee.  Full thickness flaps developed.  Median parapatellar arthrotomy was performed, elevated soft tissues medially, preserving the MCL.  Patella was gently everted, knee was flexed. Osteoarthrosis patellofemoral and medial compartment  end-stage was noted.  I removed remnants of the medial and lateral meniscus as well as the ACL.  Debrided the fat pad.  I placed a notch above the femoral notch for starting hole for utilization of the femoral drill.  We entered the canal without  difficulty.  Irrigated it, placed a T-handle reamer and was within bone.  I then used an intramedullary guide, 5-degree left, 10 off the distal femur due to slight flexion contracture.  This was  then pinned.  I performed a distal femoral cut with soft  tissues protected at all times.  I then sized the femur off the anterior cortex.  It sized to an 8.  This was pinned in 3 degrees of external rotation.  I then performed the anterior, posterior and chamfer cuts with soft tissues protected posteriorly at  all times.  I then subluxed the tibia. The low compartment was medial, posterior and anteromedially.  Using external alignment guide to off the defect, which was medial and parallel to the shaft bisecting the tibiotalar joint 3-degree slope.  This was  then pinned.  This was 9 off the high side, 2 off the low side.  I performed a proximal tibial cut.  I removed osteophytes with a bare rongeur. Again, protected the soft tissues and popliteus posteriorly at all times.  I then used an extension block with  the knee in full extension with a 6 and it was satisfactory in full extension and had an equivalent flexion gap.  Following this, I resubluxed the tibia, sized the tibia to an 8, just to the medial aspect of tibial tubercle, harvested bone and impacted  into the distal femur.  I drilled centrally, used our punch guide on the tibia.  We turned attention to the femur and performed our box cut utilizing box cut guide, bisecting the canal.  This was performed without difficulty.  I then used a trial femur,  which fit flush.  This was an 8.  We drilled our lug holes and placed a 6 mm insert and had full extension, full flexion, good stability with varus and valgus stressing at 0 and 30 degrees, and negative anterior drawer.  I then everted the patella,  measured it  to a 25 plane to a 16 utilizing the patellar cutting guide.  I then measured it sized to a 41.  I used a paddle to place it parallel to the joint.  Drilled our peg holes, placed a trial 41 patella, reduced it, had excellent patellofemoral  tracking.  I then removed all instrumentation, checked posteriorly.  Remnants of menisci removed.   Cauterized the geniculates.  Popliteus and capsule was intact.  Copiously irrigated with pulsatile lavage and flexed the knee, dried all surfaces  thoroughly.  Mixed cement on the back table under vacuum.  I then used cement in placed in the tibial canal, digitally pressurizing it after drilled pin holes in the medial tibial plateau as it was slightly eburnated.  I then placed cement on the tibial  tray and then impacted it into the proximal tibia.  Redundant cement removed.  I cemented, the femoral component was cemented on the femur and the component.  I impacted it fit flush without difficulty.  I placed a size 6 insert and reduced it, held in  axial load throughout the curing of the cement with the knee in extension.  I then cemented and clamped the patella as well, placed Marcaine with epinephrine into the joint and then Prontosan after 5 minutes.  Covered the joint after appropriate curing  of the cement occurred.  Tourniquet was deflated and there was minor bleeding, which was cauterized.  I had full flexion and full extension and good stability to varus valgus stressing at 0 and 30 degrees, negative anterior drawer, so we selected a 6,  the 6 trial was removed and I meticulously removed all redundant cement.  Copiously irrigated with pulsatile lavage and Prontosan irrigation.  Subluxed the tibia, placed a 6 permanent insert and then reduced this.  I again had full extension, full  flexion, good stability to varus valgus stressing at 0 and 30 degrees, negative anterior drawer.  Next, the knee was placed in slight flexion and reapproximated the parapatellar arthrotomy with #1 Vicryl in interrupted figure-of-eight sutures followed by  oversewing with a running Stratafix.  I had excellent patellofemoral tracking following this and excellent stability.  Copiously irrigated subcutaneous tissues.  They were closed with 2-0 and skin was closed with staples.  He had flexion to gravity at  90 degrees.  Sterile  dressing applied, placed in immobilizer and transported to the recovery room in satisfactory condition.  The patient tolerated the procedure well.  No complications.  Assistant, Lacie Draft, PA was used throughout the case for patient positioning, holding, closure.  Blood loss 100 mL.   NIK D: 06/24/2021 4:15:15 pm T: 06/25/2021 1:53:00 am  JOB: [id number]/ [id number]

## 2021-06-25 NOTE — Plan of Care (Signed)

## 2021-06-25 NOTE — Plan of Care (Signed)
Pt stable at this time. No needs at time of d/c instructions and education. Family at bedside. Dressing clean, dry, and intact.

## 2021-06-27 ENCOUNTER — Other Ambulatory Visit: Payer: Self-pay

## 2021-06-27 ENCOUNTER — Encounter (HOSPITAL_COMMUNITY): Payer: Self-pay | Admitting: Specialist

## 2021-06-27 DIAGNOSIS — M25562 Pain in left knee: Secondary | ICD-10-CM | POA: Diagnosis not present

## 2021-06-27 NOTE — Anesthesia Postprocedure Evaluation (Addendum)
Anesthesia Post Note  Patient: Glenn Murray  Procedure(s) Performed: TOTAL KNEE ARTHROPLASTY (Left: Knee)     Patient location during evaluation: PACU Anesthesia Type: Spinal Level of consciousness: awake and alert Pain management: pain level controlled Vital Signs Assessment: post-procedure vital signs reviewed and stable Respiratory status: spontaneous breathing Cardiovascular status: stable Anesthetic complications: no   No notable events documented.  Last Vitals:  Vitals:   06/25/21 0538 06/25/21 0946  BP: 117/61 119/71  Pulse: 66 65  Resp: 20 17  Temp: 36.4 C 36.5 C  SpO2: 96% 97%    Last Pain:  Vitals:   06/25/21 1226  TempSrc:   PainSc: [place]

## 2021-07-01 DIAGNOSIS — M25562 Pain in left knee: Secondary | ICD-10-CM | POA: Diagnosis not present

## 2021-07-04 ENCOUNTER — Other Ambulatory Visit: Payer: Self-pay

## 2021-07-04 MED ORDER — FUROSEMIDE 20 MG PO TABS: 20.0000 mg | ORAL_TABLET | ORAL | 11 refills | Status: DC | PRN

## 2021-07-06 DIAGNOSIS — M25562 Pain in left knee: Secondary | ICD-10-CM | POA: Diagnosis not present

## 2021-07-08 DIAGNOSIS — M25562 Pain in left knee: Secondary | ICD-10-CM | POA: Diagnosis not present

## 2021-07-08 DIAGNOSIS — Z471 Aftercare following joint replacement surgery: Secondary | ICD-10-CM | POA: Diagnosis not present

## 2021-07-08 DIAGNOSIS — Z4789 Encounter for other orthopedic aftercare: Secondary | ICD-10-CM | POA: Diagnosis not present

## 2021-07-12 DIAGNOSIS — M25562 Pain in left knee: Secondary | ICD-10-CM | POA: Diagnosis not present

## 2021-07-15 DIAGNOSIS — M25562 Pain in left knee: Secondary | ICD-10-CM | POA: Diagnosis not present

## 2021-07-19 DIAGNOSIS — M25562 Pain in left knee: Secondary | ICD-10-CM | POA: Diagnosis not present

## 2021-07-21 DIAGNOSIS — M25562 Pain in left knee: Secondary | ICD-10-CM | POA: Diagnosis not present

## 2021-07-26 DIAGNOSIS — M25562 Pain in left knee: Secondary | ICD-10-CM | POA: Diagnosis not present

## 2021-07-29 DIAGNOSIS — M25562 Pain in left knee: Secondary | ICD-10-CM | POA: Diagnosis not present

## 2021-08-15 ENCOUNTER — Other Ambulatory Visit: Payer: Self-pay

## 2021-08-15 MED ORDER — SPIRONOLACTONE 25 MG PO TABS: 12.5000 mg | ORAL_TABLET | Freq: Every day | ORAL | 2 refills | Status: DC

## 2021-08-15 MED ORDER — LOSARTAN POTASSIUM 25 MG PO TABS: 25.0000 mg | ORAL_TABLET | Freq: Every day | ORAL | 2 refills | Status: DC

## 2021-08-22 ENCOUNTER — Telehealth: Payer: Self-pay | Admitting: Cardiology

## 2021-08-22 MED ORDER — EPLERENONE 25 MG PO TABS: 25.0000 mg | ORAL_TABLET | Freq: Every day | ORAL | 1 refills | Status: DC

## 2021-08-22 NOTE — Telephone Encounter (Signed)
Pts PCP is reaching out about pt having side effects of breast tenderness and swelling due to taking spironolactone.  ? ?PCP is asking if Dr. Johney Frame should switch him to an alternative regimen, to help with complaints.  ? ?Will route this message to Dr. Johney Frame and our PharmD team to further review and advise on this matter. ? ?Will follow-up with the pt accordingly thereafter.  ?

## 2021-08-22 NOTE — Telephone Encounter (Signed)
Pt c/o medication issue: ? ?1. Name of Medication:  ? spironolactone (ALDACTONE) 25 MG tablet  ? ? ?2. How are you currently taking this medication (dosage and times per day)? Take 0.5 tablets (12.5 mg total) by mouth daily. ? ?3. Are you having a reaction (difficulty breathing--STAT)? No, Causing breast swelling and tenderness ? ?4. What is your medication issue? Ellin Saba Physician stated that pt is having above reactions. She wants to know if Dr. Johney Frame can adjust the dosage or prescribe another medication. ?

## 2021-08-22 NOTE — Telephone Encounter (Signed)
Could try eplerenone '25mg'$  daily instead. Lower risk of gyncomastia. Or drop the aldosterone antagonist all together and increase his losartan. ?

## 2021-08-22 NOTE — Telephone Encounter (Signed)
Pt aware that per Dr. Johney Frame and our Pharmacist, we will stop his spironolactone and start him on eplerenone (Inspra) 25 mg po daily. ?Confirmed the pharmacy of choice with the pt.  ?Updated spironolactone in his allergies as causing gynecomastia.  ?Advised the pt to continue monitoring his pressures at home and keep [place] posted as needed, incase we need to further adjust any of his BP meds, like losartan. ?Pt verbalized understanding and agrees with this plan. ?

## 2021-08-22 NOTE — Telephone Encounter (Signed)
Totally agree. Would trial eplerenone '25mg'$  daily instead of spironolactone if medication is covered well enough by insurance. If not, we can just increase his losartan to '50mg'$  daily. ?

## 2021-08-22 NOTE — Addendum Note (Signed)
Addended by: Nuala Alpha on: 08/22/2021 01:24 PM ? ? Modules accepted: Orders ? ?

## 2021-08-23 DIAGNOSIS — I5042 Chronic combined systolic (congestive) and diastolic (congestive) heart failure: Secondary | ICD-10-CM | POA: Diagnosis not present

## 2021-08-23 DIAGNOSIS — I2510 Atherosclerotic heart disease of native coronary artery without angina pectoris: Secondary | ICD-10-CM | POA: Diagnosis not present

## 2021-08-23 DIAGNOSIS — E782 Mixed hyperlipidemia: Secondary | ICD-10-CM | POA: Diagnosis not present

## 2021-08-29 ENCOUNTER — Telehealth: Payer: Self-pay

## 2021-08-29 NOTE — Telephone Encounter (Signed)
**Note De-Identified Glenn Murray Obfuscation** Eplerenone PA started through covermymeds. ?Key: [id number] ?

## 2021-08-30 ENCOUNTER — Encounter: Payer: Self-pay | Admitting: Cardiology

## 2021-08-30 NOTE — Telephone Encounter (Signed)
**Note De-Identified Glenn Murray Obfuscation** Glenn Murray Glenn Murray - PA Case ID: [id number] - Rx #: [id number] ?Outcome: Approved on April 10 ?Coverage Starts on: 05/22/2021 12:00:00 AM, Coverage Ends on: 05/21/2022 12:00:00 AM.  ?Drug: Eplerenone '25MG'$  tablets ?Form: Administrator, sports PA Form ? ?I have notified [facility] [id number] [place], [place] (Ph: [phone]) of this approval. ?
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
## 2021-09-19 ENCOUNTER — Ambulatory Visit (INDEPENDENT_AMBULATORY_CARE_PROVIDER_SITE_OTHER): Payer: Medicare Other | Admitting: Cardiology

## 2021-09-19 ENCOUNTER — Encounter: Payer: Self-pay | Admitting: Cardiology

## 2021-09-19 ENCOUNTER — Other Ambulatory Visit: Payer: Medicare Other | Admitting: *Deleted

## 2021-09-19 VITALS — BP 118/78 | HR 77 | Ht 69.0 in | Wt 219.4 lb

## 2021-09-19 DIAGNOSIS — I2510 Atherosclerotic heart disease of native coronary artery without angina pectoris: Secondary | ICD-10-CM

## 2021-09-19 DIAGNOSIS — R42 Dizziness and giddiness: Secondary | ICD-10-CM

## 2021-09-19 DIAGNOSIS — I447 Left bundle-branch block, unspecified: Secondary | ICD-10-CM | POA: Diagnosis not present

## 2021-09-19 DIAGNOSIS — I493 Ventricular premature depolarization: Secondary | ICD-10-CM

## 2021-09-19 DIAGNOSIS — Z01818 Encounter for other preprocedural examination: Secondary | ICD-10-CM

## 2021-09-19 DIAGNOSIS — E782 Mixed hyperlipidemia: Secondary | ICD-10-CM

## 2021-09-19 DIAGNOSIS — N400 Enlarged prostate without lower urinary tract symptoms: Secondary | ICD-10-CM | POA: Diagnosis not present

## 2021-09-19 DIAGNOSIS — Z79899 Other long term (current) drug therapy: Secondary | ICD-10-CM

## 2021-09-19 DIAGNOSIS — Z0189 Encounter for other specified special examinations: Secondary | ICD-10-CM

## 2021-09-19 DIAGNOSIS — I5042 Chronic combined systolic (congestive) and diastolic (congestive) heart failure: Secondary | ICD-10-CM

## 2021-09-19 DIAGNOSIS — E785 Hyperlipidemia, unspecified: Secondary | ICD-10-CM

## 2021-09-19 LAB — BASIC METABOLIC PANEL
BUN/Creatinine Ratio: 21 (ref 10–24)
BUN: 21 mg/dL (ref 8–27)
CO2: 25 mmol/L (ref 20–29)
Calcium: 9.5 mg/dL (ref 8.6–10.2)
Chloride: 104 mmol/L (ref 96–106)
Creatinine, Ser: 1.01 mg/dL (ref 0.76–1.27)
Glucose: 106 mg/dL — ABNORMAL HIGH (ref 70–99)
Potassium: 4.5 mmol/L (ref 3.5–5.2)
Sodium: 139 mmol/L (ref 134–144)
eGFR: 81 mL/min/{1.73_m2} (ref >59)

## 2021-09-19 LAB — LIPID PANEL
Chol/HDL Ratio: 3.6 ratio (ref 0.0–5.0)
Cholesterol, Total: 150 mg/dL (ref 100–199)
HDL: 42 mg/dL (ref >39)
LDL Chol Calc (NIH): 85 mg/dL (ref 0–99)
Triglycerides: 127 mg/dL (ref 0–149)
VLDL Cholesterol Cal: 23 mg/dL (ref 5–40)

## 2021-09-19 LAB — PSA: Prostate Specific Ag, Serum: 0.2 ng/mL (ref 0.0–4.0)
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
## 2021-09-19 NOTE — Patient Instructions (Signed)
Medication Instructions:  ? ?Your physician recommends that you continue on your current medications as directed. Please refer to the Current Medication list given to you today. ? ?*If you need a refill on your cardiac medications before your next appointment, please call your pharmacy* ? ? ?Lab Work: ? ?TODAY--LIPIDS, BMET, AND PSA LEVEL ? ?If you have labs (blood work) drawn today and your tests are completely normal, you will receive your results only by: ?MyChart Message (if you have MyChart) OR ?A paper copy in the mail ?If you have any lab test that is abnormal or we need to change your treatment, we will call you to review the results. ? ? ? ?Follow-Up: ?At [facility], you and your health needs are our priority.  As part of our continuing mission to provide you with exceptional heart care, we have created designated Provider Care Teams.  These Care Teams include your primary Cardiologist (physician) and Advanced Practice Providers (APPs -  Physician Assistants and Nurse Practitioners) who all work together to provide you with the care you need, when you need it. ? ?We recommend signing up for the patient portal called "MyChart".  Sign up information is provided on this After Visit Summary.  MyChart is used to connect with patients for Virtual Visits (Telemedicine).  Patients are able to view lab/test results, encounter notes, upcoming appointments, etc.  Non-urgent messages can be sent to your provider as well.   ?To learn more about what you can do with MyChart, go to [email or web address].   ? ?Your next appointment:   ?6 month(s) ? ?The format for your next appointment:   ?In Person ? ?Provider:   ?Freada Bergeron, MD { ? ? ? ?Important Information About Sugar ? ? ? ? ? ? ?

## 2021-09-20 ENCOUNTER — Telehealth: Payer: Self-pay | Admitting: Cardiology

## 2021-09-20 ENCOUNTER — Encounter: Payer: Self-pay | Admitting: Cardiology

## 2021-09-20 DIAGNOSIS — Z79899 Other long term (current) drug therapy: Secondary | ICD-10-CM

## 2021-09-20 DIAGNOSIS — E782 Mixed hyperlipidemia: Secondary | ICD-10-CM

## 2021-09-20 DIAGNOSIS — I25118 Atherosclerotic heart disease of native coronary artery with other forms of angina pectoris: Secondary | ICD-10-CM

## 2021-09-20 DIAGNOSIS — I2510 Atherosclerotic heart disease of native coronary artery without angina pectoris: Secondary | ICD-10-CM

## 2021-09-20 MED ORDER — EZETIMIBE 10 MG PO TABS: 10.0000 mg | ORAL_TABLET | Freq: Every day | ORAL | 2 refills | Status: DC

## 2021-09-20 MED ORDER — ROSUVASTATIN CALCIUM 20 MG PO TABS: 20.0000 mg | ORAL_TABLET | Freq: Every day | ORAL | 2 refills | Status: DC

## 2021-09-20 NOTE — Telephone Encounter (Signed)
Pt returning call regarding test results. Please advise ?

## 2021-09-20 NOTE — Telephone Encounter (Signed)
The patient has been notified of the result and verbalized understanding.  All questions (if any) were answered. ? ?Pt states he will restart back taking his zetia 10 mg po daily.  He wants to have his lipids checked same day as his already scheduled lab appt for 10/28/21.  He is aware to come fasting.  ?Confirmed the pharmacy of choice with the pt.  He is also needing a refill of his crestor 20 mg.  Sent both scripts in.  ?Pt verbalized understanding and agrees with this plan. ? ?

## 2021-09-20 NOTE — Telephone Encounter (Signed)
-----   Message from Freada Bergeron, MD sent at 09/20/2021 11:49 AM EDT ----- ?His electrolytes and kidney function look great. PSA is normal. His LDL is slightly above goal at 85 (goal LDL<70). He can either try to work on lifestyle modifications for 3 months and repeat testing OR we can restart the zetia '10mg'$  daily and repeat lipids in 6-8 weeks.  ?

## 2021-09-21 NOTE — Telephone Encounter (Signed)
See result note for further details.  ?

## 2021-09-26 ENCOUNTER — Encounter: Payer: Self-pay | Admitting: Cardiology

## 2021-10-04 ENCOUNTER — Encounter: Payer: Self-pay | Admitting: Cardiology

## 2021-10-04 DIAGNOSIS — L905 Scar conditions and fibrosis of skin: Secondary | ICD-10-CM | POA: Diagnosis not present

## 2021-10-04 DIAGNOSIS — D492 Neoplasm of unspecified behavior of bone, soft tissue, and skin: Secondary | ICD-10-CM | POA: Diagnosis not present

## 2021-10-04 DIAGNOSIS — L570 Actinic keratosis: Secondary | ICD-10-CM | POA: Diagnosis not present

## 2021-10-04 DIAGNOSIS — D485 Neoplasm of uncertain behavior of skin: Secondary | ICD-10-CM | POA: Diagnosis not present

## 2021-10-04 DIAGNOSIS — L738 Other specified follicular disorders: Secondary | ICD-10-CM | POA: Diagnosis not present

## 2021-10-04 DIAGNOSIS — B353 Tinea pedis: Secondary | ICD-10-CM | POA: Diagnosis not present

## 2021-10-28 ENCOUNTER — Other Ambulatory Visit: Payer: Medicare Other

## 2021-10-28 DIAGNOSIS — I2510 Atherosclerotic heart disease of native coronary artery without angina pectoris: Secondary | ICD-10-CM

## 2021-10-28 DIAGNOSIS — Z79899 Other long term (current) drug therapy: Secondary | ICD-10-CM

## 2021-10-28 DIAGNOSIS — Z0189 Encounter for other specified special examinations: Secondary | ICD-10-CM

## 2021-10-28 DIAGNOSIS — I77819 Aortic ectasia, unspecified site: Secondary | ICD-10-CM

## 2021-10-28 DIAGNOSIS — E782 Mixed hyperlipidemia: Secondary | ICD-10-CM

## 2021-10-28 DIAGNOSIS — I25118 Atherosclerotic heart disease of native coronary artery with other forms of angina pectoris: Secondary | ICD-10-CM

## 2021-10-28 LAB — BASIC METABOLIC PANEL
BUN/Creatinine Ratio: 19 (ref 10–24)
BUN: 20 mg/dL (ref 8–27)
CO2: 25 mmol/L (ref 20–29)
Calcium: 9.7 mg/dL (ref 8.6–10.2)
Chloride: 103 mmol/L (ref 96–106)
Creatinine, Ser: 1.07 mg/dL (ref 0.76–1.27)
Glucose: 104 mg/dL — ABNORMAL HIGH (ref 70–99)
Potassium: 4.6 mmol/L (ref 3.5–5.2)
Sodium: 140 mmol/L (ref 134–144)
eGFR: 76 mL/min/{1.73_m2} (ref >59)

## 2021-10-28 LAB — LIPID PANEL
Chol/HDL Ratio: 2.9 ratio (ref 0.0–5.0)
Cholesterol, Total: 132 mg/dL (ref 100–199)
HDL: 46 mg/dL (ref >39)
LDL Chol Calc (NIH): 59 mg/dL (ref 0–99)
Triglycerides: 162 mg/dL — ABNORMAL HIGH (ref 0–149)
VLDL Cholesterol Cal: 27 mg/dL (ref 5–40)

## 2021-11-04 ENCOUNTER — Inpatient Hospital Stay: Admission: RE | Admit: 2021-11-04 | Payer: Medicare Other | Source: Ambulatory Visit

## 2021-11-07 ENCOUNTER — Ambulatory Visit (HOSPITAL_COMMUNITY)
Admission: RE | Admit: 2021-11-07 | Discharge: 2021-11-07 | Disposition: A | Discharge status: Home / Self Care | Payer: Medicare Other | Source: Ambulatory Visit | Attending: Cardiology | Admitting: Cardiology

## 2021-11-07 ENCOUNTER — Encounter (HOSPITAL_COMMUNITY): Payer: Self-pay

## 2021-11-07 DIAGNOSIS — Z0189 Encounter for other specified special examinations: Secondary | ICD-10-CM | POA: Diagnosis not present

## 2021-11-07 DIAGNOSIS — I77819 Aortic ectasia, unspecified site: Secondary | ICD-10-CM | POA: Insufficient documentation

## 2021-11-07 DIAGNOSIS — I2510 Atherosclerotic heart disease of native coronary artery without angina pectoris: Secondary | ICD-10-CM | POA: Insufficient documentation

## 2021-11-07 DIAGNOSIS — I700 Atherosclerosis of aorta: Secondary | ICD-10-CM | POA: Diagnosis not present

## 2021-11-07 MED ORDER — SODIUM CHLORIDE (PF) 0.9 % IJ SOLN
INTRAMUSCULAR | Status: AC
  Filled 2021-11-07: qty 50

## 2021-11-07 MED ORDER — IOHEXOL 350 MG/ML SOLN
80.0000 mL | Freq: Once | INTRAVENOUS | Status: AC | PRN
  Administered 2021-11-07: 80 mL via INTRAVENOUS

## 2021-11-10 DIAGNOSIS — J343 Hypertrophy of nasal turbinates: Secondary | ICD-10-CM | POA: Diagnosis not present

## 2021-11-10 DIAGNOSIS — J310 Chronic rhinitis: Secondary | ICD-10-CM | POA: Diagnosis not present

## 2021-11-18 DIAGNOSIS — Z23 Encounter for immunization: Secondary | ICD-10-CM | POA: Diagnosis not present

## 2022-01-30 DIAGNOSIS — R609 Edema, unspecified: Secondary | ICD-10-CM | POA: Diagnosis not present

## 2022-01-30 DIAGNOSIS — E782 Mixed hyperlipidemia: Secondary | ICD-10-CM | POA: Diagnosis not present

## 2022-01-30 DIAGNOSIS — R6882 Decreased libido: Secondary | ICD-10-CM | POA: Diagnosis not present

## 2022-01-30 DIAGNOSIS — N400 Enlarged prostate without lower urinary tract symptoms: Secondary | ICD-10-CM | POA: Diagnosis not present

## 2022-01-30 DIAGNOSIS — N1831 Chronic kidney disease, stage 3a: Secondary | ICD-10-CM | POA: Diagnosis not present

## 2022-01-30 DIAGNOSIS — Z1331 Encounter for screening for depression: Secondary | ICD-10-CM | POA: Diagnosis not present

## 2022-01-30 DIAGNOSIS — F411 Generalized anxiety disorder: Secondary | ICD-10-CM | POA: Diagnosis not present

## 2022-01-30 DIAGNOSIS — J309 Allergic rhinitis, unspecified: Secondary | ICD-10-CM | POA: Diagnosis not present

## 2022-01-30 DIAGNOSIS — Z0000 Encounter for general adult medical examination without abnormal findings: Secondary | ICD-10-CM | POA: Diagnosis not present

## 2022-01-30 DIAGNOSIS — M25562 Pain in left knee: Secondary | ICD-10-CM | POA: Diagnosis not present

## 2022-01-30 DIAGNOSIS — I2510 Atherosclerotic heart disease of native coronary artery without angina pectoris: Secondary | ICD-10-CM | POA: Diagnosis not present

## 2022-01-30 DIAGNOSIS — Z125 Encounter for screening for malignant neoplasm of prostate: Secondary | ICD-10-CM | POA: Diagnosis not present

## 2022-01-30 DIAGNOSIS — Z23 Encounter for immunization: Secondary | ICD-10-CM | POA: Diagnosis not present
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
## 2022-03-22 ENCOUNTER — Encounter: Payer: Self-pay | Admitting: Cardiology

## 2022-03-22 ENCOUNTER — Ambulatory Visit: Payer: Medicare Other | Attending: Cardiology | Admitting: Cardiology

## 2022-03-22 VITALS — BP 122/80 | HR 67 | Ht 69.0 in | Wt 232.2 lb

## 2022-03-22 DIAGNOSIS — I493 Ventricular premature depolarization: Secondary | ICD-10-CM | POA: Insufficient documentation

## 2022-03-22 DIAGNOSIS — E782 Mixed hyperlipidemia: Secondary | ICD-10-CM | POA: Insufficient documentation

## 2022-03-22 DIAGNOSIS — I2510 Atherosclerotic heart disease of native coronary artery without angina pectoris: Secondary | ICD-10-CM | POA: Insufficient documentation

## 2022-03-22 DIAGNOSIS — I447 Left bundle-branch block, unspecified: Secondary | ICD-10-CM | POA: Diagnosis not present

## 2022-03-22 DIAGNOSIS — R42 Dizziness and giddiness: Secondary | ICD-10-CM | POA: Insufficient documentation

## 2022-03-22 DIAGNOSIS — I5042 Chronic combined systolic (congestive) and diastolic (congestive) heart failure: Secondary | ICD-10-CM | POA: Insufficient documentation

## 2022-03-22 NOTE — Patient Instructions (Signed)
Medication Instructions:   Your physician recommends that you continue on your current medications as directed. Please refer to the Current Medication list given to you today.  *If you need a refill on your cardiac medications before your next appointment, please call your pharmacy*   Lab Work:  IN 6 MONTHS HERE IN THE OFFICE--CHECK BMET AND LIPIDS AT THAT TIME--SCHEDULING PLEASE [place]  If you have labs (blood work) drawn today and your tests are completely normal, you will receive your results only by: MyChart Message (if you have MyChart) OR A paper copy in the mail If you have any lab test that is abnormal or we need to change your treatment, we will call you to review the results.   Testing/Procedures:  Your physician has requested that you have an echocardiogram. Echocardiography is a painless test that uses sound waves to create images of your heart. It provides your doctor with information about the size and shape of your heart and how well your heart's chambers and valves are working. This procedure takes approximately one hour. There are no restrictions for this procedure. Please do NOT wear cologne, perfume, aftershave, or lotions (deodorant is allowed). Please arrive 15 minutes prior to your appointment time.    Follow-Up: At [facility], you and your health needs are our priority.  As part of our continuing mission to provide you with exceptional heart care, we have created designated Provider Care Teams.  These Care Teams include your primary Cardiologist (physician) and Advanced Practice Providers (APPs -  Physician Assistants and Nurse Practitioners) who all work together to provide you with the care you need, when you need it.  We recommend signing up for the patient portal called "MyChart".  Sign up information is provided on this After Visit Summary.  MyChart is used to connect with patients for Virtual Visits (Telemedicine).   Patients are able to view lab/test results, encounter notes, upcoming appointments, etc.  Non-urgent messages can be sent to your provider as well.   To learn more about what you can do with MyChart, go to [email or web address].    Your next appointment:   6 month(s)  The format for your next appointment:   In Person  Provider:   Freada Bergeron, MD      Important Information About Sugar

## 2022-03-23 DIAGNOSIS — N400 Enlarged prostate without lower urinary tract symptoms: Secondary | ICD-10-CM | POA: Diagnosis not present

## 2022-03-23 DIAGNOSIS — I2510 Atherosclerotic heart disease of native coronary artery without angina pectoris: Secondary | ICD-10-CM | POA: Diagnosis not present

## 2022-03-23 DIAGNOSIS — E782 Mixed hyperlipidemia: Secondary | ICD-10-CM | POA: Diagnosis not present

## 2022-03-23 DIAGNOSIS — I5042 Chronic combined systolic (congestive) and diastolic (congestive) heart failure: Secondary | ICD-10-CM | POA: Diagnosis not present

## 2022-04-06 DIAGNOSIS — L2389 Allergic contact dermatitis due to other agents: Secondary | ICD-10-CM | POA: Diagnosis not present

## 2022-04-06 DIAGNOSIS — Z872 Personal history of diseases of the skin and subcutaneous tissue: Secondary | ICD-10-CM | POA: Diagnosis not present

## 2022-04-06 DIAGNOSIS — L988 Other specified disorders of the skin and subcutaneous tissue: Secondary | ICD-10-CM | POA: Diagnosis not present

## 2022-04-06 DIAGNOSIS — L821 Other seborrheic keratosis: Secondary | ICD-10-CM | POA: Diagnosis not present

## 2022-04-06 DIAGNOSIS — L814 Other melanin hyperpigmentation: Secondary | ICD-10-CM | POA: Diagnosis not present

## 2022-04-06 DIAGNOSIS — D225 Melanocytic nevi of trunk: Secondary | ICD-10-CM | POA: Diagnosis not present

## 2022-04-12 ENCOUNTER — Ambulatory Visit (HOSPITAL_COMMUNITY): Payer: Medicare Other | Attending: Internal Medicine

## 2022-04-12 DIAGNOSIS — I2510 Atherosclerotic heart disease of native coronary artery without angina pectoris: Secondary | ICD-10-CM | POA: Diagnosis not present

## 2022-04-12 DIAGNOSIS — I447 Left bundle-branch block, unspecified: Secondary | ICD-10-CM

## 2022-04-12 DIAGNOSIS — I5042 Chronic combined systolic (congestive) and diastolic (congestive) heart failure: Secondary | ICD-10-CM | POA: Insufficient documentation

## 2022-04-12 LAB — ECHOCARDIOGRAM COMPLETE
Area-P 1/2: 2.47 cm2
Calc EF: 38.4 %
S' Lateral: 3.85 cm
Single Plane A2C EF: 42.8 %
Single Plane A4C EF: 31.9 %

## 2022-04-20 ENCOUNTER — Telehealth: Payer: Self-pay | Admitting: Cardiology

## 2022-04-20 NOTE — Telephone Encounter (Signed)
I spoke with patient and reviewed echo results with him.  Patient has been scheduled to see Richardson Dopp, PA on 04/24/22

## 2022-04-20 NOTE — Telephone Encounter (Signed)
-----   Message from Freada Bergeron, MD sent at 04/20/2022  1:27 PM EST ----- Called and left a message for the patient about his echo results. EF is slightly lower than prior echo at 35-40%. This is likely due to the presence of his left bundle branch block If he overall feels well and is able to exert himself without issue, we can continue to monitor at this time with a yearly echo. His blood pressure dropped too low with the medications in past but we can always try to add something again in the future. If he is feeling more SOB or fatigued, let me know because we can talk about possible device placement to help improve the synchronization of his heart muscle.   Can we just get him an appointment with APP to see if we can optimize meds at all?  Thank you!

## 2022-04-20 NOTE — Telephone Encounter (Signed)
Patient is returning Dr. Jacolyn Reedy call to discuss echo results. Patient was scheduled for 12/04 with PA per her request, but he would still like a callback to discuss results today.

## 2022-04-22 DIAGNOSIS — Z23 Encounter for immunization: Secondary | ICD-10-CM | POA: Diagnosis not present

## 2022-04-23 DIAGNOSIS — I5020 Unspecified systolic (congestive) heart failure: Secondary | ICD-10-CM | POA: Insufficient documentation

## 2022-04-23 NOTE — Progress Notes (Unsigned)
Cardiology Office Note:    Date:  04/24/2022   ID:  Glenn Murray, DOB 1951/03/04, MRN [id number]  PCP:  Maury Dus, MD  [facility] Cardiologist:  Freada Bergeron, MD     Referring MD: Maury Dus, MD   Chief Complaint:  F/u for CHF    Patient Profile: Coronary artery disease  Known CTO of RCA  Cath 07/07/20: pRCA 100 (L-R collats) (HFrEF) heart failure with reduced ejection fraction  TTE 07/22/20: EF 40-45 TTE 11/10/20: EF 40-45 TTE 04/12/22: EF 35-40, no RWMA, mod asym LVH, Gr 1 DD, NL RVSF, mod LAE, trivial MR, AV sclerosis w/o AS Intol of Spiro (gynecomastia) >> Eplerenone  ? BP >> off Eplerenone, Losartan  Left Bundle Branch Block PVCs Hypertension  Hyperlipidemia  Chronic kidney disease  BPH Prostate CA  GERD   Cardiac Studies & Procedures   CARDIAC CATHETERIZATION  CARDIAC CATHETERIZATION 07/07/2020  Narrative  Prox RCA to Mid RCA lesion is 100% stenosed.  1. No obstructive disease in the LAD or Circumflex 2. Chronic occlusion of the heavily calcified mid RCA with long segment occlusion. The mid and distal RCA fills from left to right collaterals. The vessel is not favorable for CTO PCI given calcification, side branches and long segment of occlusion.  Recommendations: Reviewed with CTO team Dr. Irish Lack. RCA lesion appears chronic and is not favorable for CTO PCI. Medical management of CAD and LV dysfunction.  Findings Coronary Findings Diagnostic  Dominance: Right  Left Anterior Descending Vessel is large.  Left Circumflex Vessel is large.  Right Coronary Artery Vessel is large. Prox RCA to Mid RCA lesion is 100% stenosed. The lesion is calcified.  Third Right Posterolateral Branch Collaterals 3rd RPL filled by collaterals from 2nd Sept.  Intervention  No interventions have been documented.     ECHOCARDIOGRAM  ECHOCARDIOGRAM COMPLETE 04/12/2022  Narrative ECHOCARDIOGRAM REPORT    Patient Name:   Glenn Murray Date of Exam: 04/12/2022 Medical Rec #:  [id number]     Height:       69.0 in Accession #:    [id number]    Weight:       232.2 lb Date of Birth:  May 07, 1951    BSA:          2.201 m Patient Age:    71 years      BP:           122/80 mmHg Patient Gender: M             HR:           59 bpm. Exam Location:  [place]  Procedure: 2D Echo, Cardiac Doppler and Color Doppler  Indications:    I25.10 CAD  History:        Patient has prior history of Echocardiogram examinations, most recent 07/22/2020. CHF, CAD, Arrythmias:LBBB; Risk Factors:Hypertension and Dyslipidemia.  Sonographer:    Coralyn Helling RDCS Referring Phys: [id number]   1. Left ventricular ejection fraction, by estimation, is 35 to 40%. Left ventricular ejection fraction by 2D MOD biplane is 38.4 %. The left ventricle has moderately decreased function. The left ventricle has no regional wall motion abnormalities. There is moderate asymmetric left ventricular hypertrophy of the basal-septal segment. Left ventricular diastolic parameters are consistent with Grade I diastolic dysfunction (impaired relaxation). 2. Right ventricular systolic function is normal. The right ventricular size is normal. Tricuspid regurgitation signal is inadequate for assessing PA pressure. 3. Left atrial size  was moderately dilated. 4. The mitral valve is abnormal. Trivial mitral valve regurgitation. 5. The aortic valve is tricuspid. Aortic valve regurgitation is not visualized. Aortic valve sclerosis/calcification is present, without any evidence of aortic stenosis.  Comparison(s): Changes from prior study are noted. 07/22/2020: LVEF 40-45%.  FINDINGS Left Ventricle: Left ventricular ejection fraction, by estimation, is 35 to 40%. Left ventricular ejection fraction by 2D MOD biplane is 38.4 %. The left ventricle has moderately decreased function. The left ventricle has no regional wall motion abnormalities. The  left ventricular internal cavity size was normal in size. There is moderate asymmetric left ventricular hypertrophy of the basal-septal segment. Abnormal (paradoxical) septal motion, consistent with left bundle branch block. Left ventricular diastolic parameters are consistent with Grade I diastolic dysfunction (impaired relaxation). Indeterminate filling pressures.  Right Ventricle: The right ventricular size is normal. No increase in right ventricular wall thickness. Right ventricular systolic function is normal. Tricuspid regurgitation signal is inadequate for assessing PA pressure.  Left Atrium: Left atrial size was moderately dilated.  Right Atrium: Right atrial size was normal in size.  Pericardium: There is no evidence of pericardial effusion.  Mitral Valve: The mitral valve is abnormal. There is mild thickening of the anterior and posterior mitral valve leaflet(s). Trivial mitral valve regurgitation.  Tricuspid Valve: The tricuspid valve is grossly normal. Tricuspid valve regurgitation is trivial.  Aortic Valve: The aortic valve is tricuspid. Aortic valve regurgitation is not visualized. Aortic valve sclerosis/calcification is present, without any evidence of aortic stenosis.  Pulmonic Valve: The pulmonic valve was normal in structure. Pulmonic valve regurgitation is not visualized.  Aorta: The aortic root and ascending aorta are structurally normal, with no evidence of dilitation.  Venous: The inferior vena cava was not well visualized.  IAS/Shunts: No atrial level shunt detected by color flow Doppler.   LEFT VENTRICLE PLAX 2D                        Biplane EF (MOD) LVIDd:         5.10 cm         LV Biplane EF:   Left LVIDs:         3.85 cm                          ventricular LV PW:         0.90 cm                          ejection LV IVS:        1.40 cm                          fraction by LVOT diam:     2.20 cm                          2D MOD LV SV:         78                                biplane is LV SV Index:   35                               38.4 %. LVOT Area:  3.80 cm Diastology LV e' medial:    5.11 cm/s LV Volumes (MOD)               LV E/e' medial:  14.3 LV vol d, MOD    237.3 ml      LV e' lateral:   6.74 cm/s A2C:                           LV E/e' lateral: 10.9 LV vol d, MOD    226.0 ml A4C: LV vol s, MOD    135.7 ml A2C: LV vol s, MOD    154.0 ml A4C: LV SV MOD A2C:   101.6 ml LV SV MOD A4C:   226.0 ml LV SV MOD BP:    89.6 ml  RIGHT VENTRICLE             IVC RV S prime:     13.90 cm/s  IVC diam: 1.10 cm TAPSE (M-mode): 2.5 cm  LEFT ATRIUM             Index        RIGHT ATRIUM           Index LA diam:        4.00 cm 1.82 cm/m   RA Pressure: 3.00 mmHg LA Vol (A2C):   69.9 ml 31.75 ml/m  RA Area:     12.10 cm LA Vol (A4C):   88.3 ml 40.11 ml/m  RA Volume:   26.90 ml  12.22 ml/m LA Biplane Vol: 81.5 ml 37.02 ml/m AORTIC VALVE LVOT Vmax:   103.00 cm/s LVOT Vmean:  70.500 cm/s LVOT VTI:    0.205 m  AORTA Ao Root diam: 3.40 cm Ao Asc diam:  3.00 cm  MITRAL VALVE                TRICUSPID VALVE MV Area (PHT): 2.47 cm     Estimated RAP:  3.00 mmHg MV Decel Time: 307 msec MV E velocity: 73.20 cm/s   SHUNTS MV A velocity: 117.00 cm/s  Systemic VTI:  0.20 m MV E/A ratio:  0.63         Systemic Diam: 2.20 cm  Lyman Bishop MD Electronically signed by Lyman Bishop MD Signature Date/Time: 04/12/2022/11:37:30 AM    Final              History of Present Illness:   Glenn Murray is a 71 y.o. male with the above problem list.  He was last seen by Dr. Johney Frame 03/22/22. He had a f/u echocardiogram that showed slight worsening of LVEF to 35-40. He follows up for review of his echocardiogram and further med titration if possible. He is here alone. He did notice wt gain and went back on Lasix 20 mg once daily instead of prn. He exercises almost daily (swims, stationary bike, weights). He has not had chest pain. He has minimal  dyspnea on exertion. He has some trouble with shortness of breath if he is flat on his back. He has minimal swelling in his leg. His L leg has been bigger since his knee surgery in Feb.      EKG:  not done    Reviewed and updated this encounter:   Tobacco  Allergies  Meds  Problems  Med Hx  Surg Hx  Fam Hx     ROS   Labs/Other Test Reviewed:    Recent Labs: 06/25/2021: Hemoglobin 13.7; Platelets 173 10/28/2021: BUN  20; Creatinine, Ser 1.07; Potassium 4.6; Sodium 140   Recent Lipid Panel Recent Labs    10/28/21 0731  CHOL 132  TRIG 162*  HDL 46  LDLCALC 59      Risk Assessment/Calculations/Metrics:         HYPERTENSION CONTROL Vitals:   04/24/22 0844 04/24/22 1032  BP: (!) 144/60 (!) 144/70    The patient's blood pressure is elevated above target today.  In order to address the patient's elevated BP: Blood pressure will be monitored at home to determine if medication changes need to be made.       Physical Exam:    VS:  BP (!) 144/70   Pulse 78   Ht '5\' 9"'$  (1.753 m)   Wt 233 lb 3.2 oz (105.8 kg)   SpO2 98%   BMI 34.44 kg/m     Wt Readings from Last 3 Encounters:  04/24/22 233 lb 3.2 oz (105.8 kg)  03/22/22 232 lb 3.2 oz (105.3 kg)  09/19/21 219 lb 6.4 oz (99.5 kg)    Constitutional:      Appearance: Healthy appearance. Not in distress.  Neck:     Vascular: JVD normal.  Pulmonary:     Effort: Pulmonary effort is normal.     Breath sounds: No wheezing. No rales.  Cardiovascular:     Normal rate. Regular rhythm. Normal S1. Normal S2.      Murmurs: There is a grade 1/6 systolic murmur at the URSB.  Edema:    Peripheral edema present.    Pretibial: trace edema of the left pretibial area. Abdominal:     Palpations: Abdomen is soft.  Neurological:     General: No focal deficit present.     Mental Status: Alert and oriented to person, place and time.         ASSESSMENT & PLAN:   HFrEF (heart failure with reduced ejection fraction) (HCC) EF was  40-45 and is now 35-40 by recent echocardiogram. He has had trouble tolerating meds due to low BP. Of note, recently, he resumed Losartan 12.5 mg once daily b/c his BP was increasing. So far, he is tolerating this. He could not take Spironolactone due to gynecomastia. He could not tol Eplerenone due to low BP. He has felt better on Toprol XL and takes it 12.5 mg twice daily. We discussed the "4 pillars" of HF treatment. We discussed the rationale for SGLT2i in HF management. He is agreeable to trying this. We discussed the potential for GU side effects. As he has a Left Bundle Branch Block, question if he could be considered for CRT. With minimal symptoms, I suspect he may not be a candidate. We can consider rechecking his echocardiogram again after GDMT maximized. Continue Losartan 12.5 mg once daily, Toprol XL 12.5 mg twice daily BMET today Start Jardiance 10 mg once daily  BMET 2 weeks Pt to send BP readings in 2 weeks  Coronary artery disease involving native coronary artery of native heart without angina pectoris Known CTO of the RCA. He is not having symptoms of angina. Continue ASA 81 mg once daily, Zetia 10 mg once daily, Crestor 20 mg once daily.  Hyperlipidemia LDL goal <70 LDL 59 in June 2023. Continue Crestor 20 mg once daily, Zetia 10 mg once daily.          Dispo:  No follow-ups on file.   Medication Adjustments/Labs and Tests Ordered: Current medicines are reviewed at length with the patient today.  Concerns regarding medicines are  outlined above.  Tests Ordered: Orders Placed This Encounter  Procedures   Basic Metabolic Panel (BMET)   Basic metabolic panel   Medication Changes: Meds ordered this encounter  Medications   empagliflozin (JARDIANCE) 10 MG TABS tablet    Sig: Take 1 tablet (10 mg total) by mouth daily before breakfast.    Dispense:  90 tablet    Refill:  1   Signed, Richardson Dopp, PA-C  04/24/2022 10:45 AM    [address],  [place], [place]  [address] Phone: [phone]; Fax: [phone]

## 2022-04-24 ENCOUNTER — Ambulatory Visit: Payer: Medicare Other | Attending: Physician Assistant | Admitting: Physician Assistant

## 2022-04-24 ENCOUNTER — Encounter: Payer: Self-pay | Admitting: Physician Assistant

## 2022-04-24 VITALS — BP 144/70 | HR 78 | Ht 69.0 in | Wt 233.2 lb

## 2022-04-24 DIAGNOSIS — Z79899 Other long term (current) drug therapy: Secondary | ICD-10-CM | POA: Diagnosis not present

## 2022-04-24 DIAGNOSIS — I5020 Unspecified systolic (congestive) heart failure: Secondary | ICD-10-CM | POA: Diagnosis not present

## 2022-04-24 DIAGNOSIS — E785 Hyperlipidemia, unspecified: Secondary | ICD-10-CM | POA: Diagnosis not present

## 2022-04-24 DIAGNOSIS — I2510 Atherosclerotic heart disease of native coronary artery without angina pectoris: Secondary | ICD-10-CM

## 2022-04-24 MED ORDER — EMPAGLIFLOZIN 10 MG PO TABS: 10.0000 mg | ORAL_TABLET | Freq: Every day | ORAL | 1 refills | Status: DC

## 2022-04-24 NOTE — Assessment & Plan Note (Signed)
Known CTO of the RCA. He is not having symptoms of angina. Continue ASA 81 mg once daily, Zetia 10 mg once daily, Crestor 20 mg once daily.

## 2022-04-24 NOTE — Patient Instructions (Signed)
Medication Instructions:   START TAKING : JARDIANCE 10 MG ONCE A DAY    AFTER TAKING A WEEK:  TRY TO START TAKING FUROSEMIDE 20 MG AS NEEDED( IF WEIGHT GAIN OF 3 LBS INA DAY OR SWELLING WITH EDEMA)  KEEP BLOOD PRESSURE LOG FOR 2 WEEKS UPLOAD IN [facility]  *If you need a refill on your cardiac medications before your next appointment, please call your pharmacy*   Lab Work: BMET [place] BMET IN 2 WEEKS ( 05-08-22)  If you have labs (blood work) drawn today and your tests are completely normal, you will receive your results only by: [facility] (if you have MyChart) OR A paper copy in the mail If you have any lab test that is abnormal or we need to change your treatment, we will call you to review the results.   Testing/Procedures: NONE ORDERED  TODAY   Follow-Up: At [facility], you and your health needs are our priority.  As part of our continuing mission to provide you with exceptional heart care, we have created designated Provider Care Teams.  These Care Teams include your primary Cardiologist (physician) and Advanced Practice Providers (APPs -  Physician Assistants and Nurse Practitioners) who all work together to provide you with the care you need, when you need it.  We recommend signing up for the patient portal called "MyChart".  Sign up information is provided on this After Visit Summary.  MyChart is used to connect with patients for Virtual Visits (Telemedicine).  Patients are able to view lab/test results, encounter notes, upcoming appointments, etc.  Non-urgent messages can be sent to your provider as well.   To learn more about what you can do with MyChart, go to [email or web address].    Your next appointment:   8 week(s)  The format for your next appointment:   In Person  Provider:   Freada Bergeron, MD or Richardson Dopp  Other Instructions   Important Information About Sugar

## 2022-04-24 NOTE — Assessment & Plan Note (Addendum)
EF was 40-45 and is now 35-40 by recent echocardiogram. He has had trouble tolerating meds due to low BP. Of note, recently, he resumed Losartan 12.5 mg once daily b/c his BP was increasing. So far, he is tolerating this. He could not take Spironolactone due to gynecomastia. He could not tol Eplerenone due to low BP. He has felt better on Toprol XL and takes it 12.5 mg twice daily. We discussed the "4 pillars" of HF treatment. We discussed the rationale for SGLT2i in HF management. He is agreeable to trying this. We discussed the potential for GU side effects. As he has a Left Bundle Branch Block, question if he could be considered for CRT. With minimal symptoms, I suspect he may not be a candidate. We can consider rechecking his echocardiogram again after GDMT maximized. Continue Losartan 12.5 mg once daily, Toprol XL 12.5 mg twice daily BMET today Start Jardiance 10 mg once daily  BMET 2 weeks Pt to send BP readings in 2 weeks

## 2022-04-24 NOTE — Assessment & Plan Note (Signed)
LDL 59 in June 2023. Continue Crestor 20 mg once daily, Zetia 10 mg once daily.

## 2022-04-25 LAB — BASIC METABOLIC PANEL
BUN/Creatinine Ratio: 18 (ref 10–24)
BUN: 17 mg/dL (ref 8–27)
CO2: 25 mmol/L (ref 20–29)
Calcium: 9.5 mg/dL (ref 8.6–10.2)
Chloride: 101 mmol/L (ref 96–106)
Creatinine, Ser: 0.96 mg/dL (ref 0.76–1.27)
Glucose: 108 mg/dL — ABNORMAL HIGH (ref 70–99)
Potassium: 4.3 mmol/L (ref 3.5–5.2)
Sodium: 139 mmol/L (ref 134–144)
eGFR: 86 mL/min/{1.73_m2} (ref >59)

## 2022-05-08 ENCOUNTER — Ambulatory Visit: Payer: Medicare Other | Attending: Physician Assistant

## 2022-05-08 DIAGNOSIS — Z79899 Other long term (current) drug therapy: Secondary | ICD-10-CM | POA: Diagnosis not present

## 2022-05-08 DIAGNOSIS — I5020 Unspecified systolic (congestive) heart failure: Secondary | ICD-10-CM

## 2022-05-08 LAB — BASIC METABOLIC PANEL
BUN/Creatinine Ratio: 18 (ref 10–24)
BUN: 19 mg/dL (ref 8–27)
CO2: 25 mmol/L (ref 20–29)
Calcium: 9.7 mg/dL (ref 8.6–10.2)
Chloride: 101 mmol/L (ref 96–106)
Creatinine, Ser: 1.04 mg/dL (ref 0.76–1.27)
Glucose: 87 mg/dL (ref 70–99)
Potassium: 4.4 mmol/L (ref 3.5–5.2)
Sodium: 140 mmol/L (ref 134–144)
eGFR: 78 mL/min/{1.73_m2} (ref >59)

## 2022-05-09 MED ORDER — EMPAGLIFLOZIN 10 MG PO TABS: 10.0000 mg | ORAL_TABLET | Freq: Every day | ORAL | 3 refills | Status: DC

## 2022-05-24 DIAGNOSIS — R69 Illness, unspecified: Secondary | ICD-10-CM | POA: Diagnosis not present

## 2022-05-31 ENCOUNTER — Telehealth: Payer: Self-pay | Admitting: *Deleted

## 2022-05-31 NOTE — Telephone Encounter (Signed)
   Pre-operative Risk Assessment    Patient Name: Glenn Murray  DOB: [birth year] MRN: [id number]      Request for Surgical Clearance    Procedure:   Colonoscopy  Date of Surgery:  Clearance 06/22/22                                 Surgeon:  Dr. Otis Brace Surgeon's Group or Practice Name:  [facility] Phone number:  [phone] Fax number:  [phone]   Type of Clearance Requested:   - Medical  - Pharmacy:  Hold Aspirin Not indicated   Type of Anesthesia:  Not Indicated   Additional requests/questions:    Signed, Greer Ee   05/31/2022, 9:55 AM

## 2022-05-31 NOTE — Telephone Encounter (Signed)
At last visit, pt noted he is very active (swimming, biking, etc) daily [>4 METs] w/o anginal symptoms. He does not have hx of PCI.  He has an appt with Dr. Johney Frame 06/19/22 as well. I think he is low risk for colonoscopy and can proceed w/o further testing. Ok to hold ASA if needed. Thanks, [employer]

## 2022-05-31 NOTE — Telephone Encounter (Signed)
   Patient Name: Glenn Murray  DOB: 01-Dec-1950 MRN: [id number]  Primary Cardiologist: Freada Bergeron, MD  Chart reviewed as part of pre-operative protocol coverage. Pre-op clearance already addressed by colleagues in earlier phone notes. To summarize recommendations:  -At last visit, pt noted he is very active (swimming, biking, etc) daily [>4 METs] w/o anginal symptoms. He does not have hx of PCI.  He has an appt with Dr. Johney Frame 06/19/22 as well. I think he is low risk for colonoscopy and can proceed w/o further testing. Ok to hold ASA if needed. -Richardson Dopp, PA-C  Since colonoscopy is considered a low bleeding risk procedure, would prefer to continue aspirin throughout.  If aspirin needs to be held, would suggest holding x 7 days prior to the procedure.  Please restart medically safe to do so.  Will route this bundled recommendation to requesting provider via Epic fax function and remove from pre-op pool. Please call with questions.  Elgie Collard, PA-C 05/31/2022, 1:22 PM
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
## 2022-06-15 DIAGNOSIS — M65312 Trigger thumb, left thumb: Secondary | ICD-10-CM | POA: Diagnosis not present

## 2022-06-15 DIAGNOSIS — M1812 Unilateral primary osteoarthritis of first carpometacarpal joint, left hand: Secondary | ICD-10-CM | POA: Diagnosis not present

## 2022-06-15 DIAGNOSIS — M180 Bilateral primary osteoarthritis of first carpometacarpal joints: Secondary | ICD-10-CM | POA: Diagnosis not present

## 2022-06-19 ENCOUNTER — Ambulatory Visit: Payer: Medicare HMO | Attending: Cardiology | Admitting: Cardiology

## 2022-06-19 ENCOUNTER — Other Ambulatory Visit: Payer: Self-pay | Admitting: *Deleted

## 2022-06-19 ENCOUNTER — Encounter: Payer: Self-pay | Admitting: Cardiology

## 2022-06-19 VITALS — BP 120/72 | HR 61 | Ht 69.0 in | Wt 229.2 lb

## 2022-06-19 DIAGNOSIS — E782 Mixed hyperlipidemia: Secondary | ICD-10-CM | POA: Diagnosis not present

## 2022-06-19 DIAGNOSIS — I25118 Atherosclerotic heart disease of native coronary artery with other forms of angina pectoris: Secondary | ICD-10-CM

## 2022-06-19 DIAGNOSIS — R42 Dizziness and giddiness: Secondary | ICD-10-CM

## 2022-06-19 DIAGNOSIS — E785 Hyperlipidemia, unspecified: Secondary | ICD-10-CM

## 2022-06-19 DIAGNOSIS — Z79899 Other long term (current) drug therapy: Secondary | ICD-10-CM

## 2022-06-19 DIAGNOSIS — I493 Ventricular premature depolarization: Secondary | ICD-10-CM | POA: Diagnosis not present

## 2022-06-19 DIAGNOSIS — I2510 Atherosclerotic heart disease of native coronary artery without angina pectoris: Secondary | ICD-10-CM

## 2022-06-19 DIAGNOSIS — I447 Left bundle-branch block, unspecified: Secondary | ICD-10-CM

## 2022-06-19 DIAGNOSIS — I5020 Unspecified systolic (congestive) heart failure: Secondary | ICD-10-CM

## 2022-06-19 DIAGNOSIS — I5042 Chronic combined systolic (congestive) and diastolic (congestive) heart failure: Secondary | ICD-10-CM

## 2022-06-19 MED ORDER — ROSUVASTATIN CALCIUM 20 MG PO TABS: 20.0000 mg | ORAL_TABLET | Freq: Every day | ORAL | 3 refills | Status: DC

## 2022-06-19 MED ORDER — EZETIMIBE 10 MG PO TABS: 10.0000 mg | ORAL_TABLET | Freq: Every day | ORAL | 3 refills | Status: DC

## 2022-06-19 NOTE — Patient Instructions (Signed)
Medication Instructions:   Your physician recommends that you continue on your current medications as directed. Please refer to the Current Medication list given to you today.  *If you need a refill on your cardiac medications before your next appointment, please call your pharmacy*   Lab Work:  NEXT MONDAY 06/26/22 HERE IN THE OFFICE--LIPIDS--PLEASE COME FASTING TO THIS LAB APPOINTMENT  If you have labs (blood work) drawn today and your tests are completely normal, you will receive your results only by: [place] (if you have MyChart) OR A paper copy in the mail If you have any lab test that is abnormal or we need to change your treatment, we will call you to review the results.    Follow-Up: At [facility], you and your health needs are our priority.  As part of our continuing mission to provide you with exceptional heart care, we have created designated Provider Care Teams.  These Care Teams include your primary Cardiologist (physician) and Advanced Practice Providers (APPs -  Physician Assistants and Nurse Practitioners) who all work together to provide you with the care you need, when you need it.  We recommend signing up for the patient portal called "MyChart".  Sign up information is provided on this After Visit Summary.  MyChart is used to connect with patients for Virtual Visits (Telemedicine).  Patients are able to view lab/test results, encounter notes, upcoming appointments, etc.  Non-urgent messages can be sent to your provider as well.   To learn more about what you can do with MyChart, go to [email or web address].    Your next appointment:   6 month(s)  Provider:   Freada Bergeron, MD

## 2022-06-22 ENCOUNTER — Other Ambulatory Visit: Payer: Self-pay

## 2022-06-22 DIAGNOSIS — K648 Other hemorrhoids: Secondary | ICD-10-CM | POA: Diagnosis not present

## 2022-06-22 DIAGNOSIS — Z86010 Personal history of colonic polyps: Secondary | ICD-10-CM | POA: Diagnosis not present

## 2022-06-22 DIAGNOSIS — D120 Benign neoplasm of cecum: Secondary | ICD-10-CM | POA: Diagnosis not present

## 2022-06-22 DIAGNOSIS — Z09 Encounter for follow-up examination after completed treatment for conditions other than malignant neoplasm: Secondary | ICD-10-CM | POA: Diagnosis not present

## 2022-06-22 DIAGNOSIS — D124 Benign neoplasm of descending colon: Secondary | ICD-10-CM | POA: Diagnosis not present

## 2022-06-22 DIAGNOSIS — D125 Benign neoplasm of sigmoid colon: Secondary | ICD-10-CM | POA: Diagnosis not present

## 2022-06-22 DIAGNOSIS — K5730 Diverticulosis of large intestine without perforation or abscess without bleeding: Secondary | ICD-10-CM | POA: Diagnosis not present

## 2022-06-22 MED ORDER — METOPROLOL SUCCINATE ER 25 MG PO TB24: 25.0000 mg | ORAL_TABLET | Freq: Every day | ORAL | 3 refills | Status: DC

## 2022-06-26 ENCOUNTER — Ambulatory Visit: Payer: Medicare HMO | Attending: Cardiology

## 2022-06-26 DIAGNOSIS — E782 Mixed hyperlipidemia: Secondary | ICD-10-CM

## 2022-06-26 DIAGNOSIS — R42 Dizziness and giddiness: Secondary | ICD-10-CM | POA: Diagnosis not present

## 2022-06-26 DIAGNOSIS — I5042 Chronic combined systolic (congestive) and diastolic (congestive) heart failure: Secondary | ICD-10-CM

## 2022-06-26 DIAGNOSIS — I2510 Atherosclerotic heart disease of native coronary artery without angina pectoris: Secondary | ICD-10-CM | POA: Diagnosis not present

## 2022-06-26 DIAGNOSIS — I447 Left bundle-branch block, unspecified: Secondary | ICD-10-CM | POA: Diagnosis not present

## 2022-06-26 DIAGNOSIS — I493 Ventricular premature depolarization: Secondary | ICD-10-CM

## 2022-06-27 ENCOUNTER — Telehealth: Payer: Self-pay | Admitting: *Deleted

## 2022-06-27 DIAGNOSIS — Z79899 Other long term (current) drug therapy: Secondary | ICD-10-CM

## 2022-06-27 DIAGNOSIS — D124 Benign neoplasm of descending colon: Secondary | ICD-10-CM | POA: Diagnosis not present

## 2022-06-27 DIAGNOSIS — D125 Benign neoplasm of sigmoid colon: Secondary | ICD-10-CM | POA: Diagnosis not present

## 2022-06-27 DIAGNOSIS — E782 Mixed hyperlipidemia: Secondary | ICD-10-CM

## 2022-06-27 DIAGNOSIS — I2510 Atherosclerotic heart disease of native coronary artery without angina pectoris: Secondary | ICD-10-CM

## 2022-06-27 DIAGNOSIS — E785 Hyperlipidemia, unspecified: Secondary | ICD-10-CM

## 2022-06-27 LAB — BASIC METABOLIC PANEL
BUN/Creatinine Ratio: 17 (ref 10–24)
BUN: 18 mg/dL (ref 8–27)
CO2: 25 mmol/L (ref 20–29)
Calcium: 9.3 mg/dL (ref 8.6–10.2)
Chloride: 104 mmol/L (ref 96–106)
Creatinine, Ser: 1.09 mg/dL (ref 0.76–1.27)
Glucose: 99 mg/dL (ref 70–99)
Potassium: 4.9 mmol/L (ref 3.5–5.2)
Sodium: 142 mmol/L (ref 134–144)
eGFR: 73 mL/min/{1.73_m2} (ref >59)

## 2022-06-27 LAB — LIPID PANEL
Chol/HDL Ratio: 3.1 ratio (ref 0.0–5.0)
Cholesterol, Total: 138 mg/dL (ref 100–199)
HDL: 44 mg/dL (ref >39)
LDL Chol Calc (NIH): 63 mg/dL (ref 0–99)
Triglycerides: 183 mg/dL — ABNORMAL HIGH (ref 0–149)
VLDL Cholesterol Cal: 31 mg/dL (ref 5–40)

## 2022-06-27 NOTE — Telephone Encounter (Signed)
The patient has been notified of the result and verbalized understanding.  All questions (if any) were answered.  Advised the pt to continue his current medication regimen,  healthy lifestyle modification, and reducing his sugar intake (pt education provided), and we will repeat his lipids in 3 months to reassess.    Scheduled the pt for repeat lipids in 3 months on 09/25/22.  He is aware to come fasting to this lab appt.   Pt verbalized understanding and agrees with this plan.

## 2022-06-27 NOTE — Telephone Encounter (Signed)
-----   Message from Freada Bergeron, MD sent at 06/27/2022 11:23 AM EST ----- Cholesterol looks good. TG are a little on the high side. Can continue lifestyle modifications and repeat in 3 months. If needed we can always give medications that target the TG.

## 2022-07-17 DIAGNOSIS — R69 Illness, unspecified: Secondary | ICD-10-CM | POA: Diagnosis not present

## 2022-08-10 DIAGNOSIS — F410 Panic disorder [episodic paroxysmal anxiety] without agoraphobia: Secondary | ICD-10-CM | POA: Diagnosis not present

## 2022-08-14 ENCOUNTER — Other Ambulatory Visit: Payer: Self-pay | Admitting: *Deleted

## 2022-08-14 MED ORDER — FUROSEMIDE 20 MG PO TABS: 20.0000 mg | ORAL_TABLET | ORAL | 1 refills | Status: DC | PRN

## 2022-09-20 ENCOUNTER — Other Ambulatory Visit: Payer: Medicare Other

## 2022-09-25 ENCOUNTER — Ambulatory Visit: Payer: Medicare HMO | Attending: Cardiology

## 2022-09-25 DIAGNOSIS — E785 Hyperlipidemia, unspecified: Secondary | ICD-10-CM

## 2022-09-25 DIAGNOSIS — E782 Mixed hyperlipidemia: Secondary | ICD-10-CM | POA: Diagnosis not present

## 2022-09-25 DIAGNOSIS — Z79899 Other long term (current) drug therapy: Secondary | ICD-10-CM

## 2022-09-25 DIAGNOSIS — I2510 Atherosclerotic heart disease of native coronary artery without angina pectoris: Secondary | ICD-10-CM

## 2022-09-25 LAB — LIPID PANEL
Chol/HDL Ratio: 2.9 ratio (ref 0.0–5.0)
Cholesterol, Total: 135 mg/dL (ref 100–199)
HDL: 46 mg/dL (ref >39)
LDL Chol Calc (NIH): 62 mg/dL (ref 0–99)
Triglycerides: 158 mg/dL — ABNORMAL HIGH (ref 0–149)
VLDL Cholesterol Cal: 27 mg/dL (ref 5–40)

## 2022-10-12 ENCOUNTER — Other Ambulatory Visit: Payer: Self-pay

## 2022-10-12 MED ORDER — FUROSEMIDE 20 MG PO TABS: 20.0000 mg | ORAL_TABLET | ORAL | 8 refills | Status: DC | PRN

## 2022-11-16 ENCOUNTER — Telehealth: Payer: Self-pay

## 2022-11-16 NOTE — Telephone Encounter (Signed)
Will send this information to our PA Nurse and Dr. Shari Prows as an Lorain Childes.

## 2022-11-16 NOTE — Telephone Encounter (Signed)
Called pt concerning a message that was left by his [employer], informing us that pt's medication jardiance 10 mg tablets is too costly for pt and pt is having a problem with the cost. Would like to see if he has to take this medication. I ensured the pt that Dr. Shari Prows would like for pt to continue this medication and that I would leave him a pt assistance application at the front desk [address] for pt to pick up and bring back for our office to see if pt can get a reduction in cost for the medication. Pt stated that this is something that he is interest in. I advised pt that if he has any other problems, questions or concerns, to give our office a call back. Pt verbalized understanding. FYI

## 2022-11-17 ENCOUNTER — Encounter: Payer: Self-pay | Admitting: Cardiology
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
## 2022-12-20 ENCOUNTER — Encounter: Payer: Self-pay | Admitting: Cardiology

## 2022-12-20 ENCOUNTER — Ambulatory Visit: Payer: Medicare HMO | Attending: Cardiology | Admitting: Cardiology

## 2022-12-20 VITALS — BP 118/76 | HR 72 | Ht 69.5 in | Wt 228.0 lb

## 2022-12-20 DIAGNOSIS — E782 Mixed hyperlipidemia: Secondary | ICD-10-CM | POA: Diagnosis not present

## 2022-12-20 DIAGNOSIS — I493 Ventricular premature depolarization: Secondary | ICD-10-CM | POA: Diagnosis not present

## 2022-12-20 DIAGNOSIS — I447 Left bundle-branch block, unspecified: Secondary | ICD-10-CM

## 2022-12-20 DIAGNOSIS — I5042 Chronic combined systolic (congestive) and diastolic (congestive) heart failure: Secondary | ICD-10-CM | POA: Diagnosis not present

## 2022-12-20 DIAGNOSIS — I2510 Atherosclerotic heart disease of native coronary artery without angina pectoris: Secondary | ICD-10-CM | POA: Diagnosis not present

## 2022-12-20 DIAGNOSIS — I5020 Unspecified systolic (congestive) heart failure: Secondary | ICD-10-CM

## 2022-12-20 NOTE — Patient Instructions (Signed)
Medication Instructions:  Your physician recommends that you continue on your current medications as directed. Please refer to the Current Medication list given to you today.  *If you need a refill on your cardiac medications before your next appointment, please call your pharmacy*    Follow-Up: At [facility], you and your health needs are our priority.  As part of our continuing mission to provide you with exceptional heart care, we have created designated Provider Care Teams.  These Care Teams include your primary Cardiologist (physician) and Advanced Practice Providers (APPs -  Physician Assistants and Nurse Practitioners) who all work together to provide you with the care you need, when you need it.  We recommend signing up for the patient portal called "MyChart".  Sign up information is provided on this After Visit Summary.  MyChart is used to connect with patients for Virtual Visits (Telemedicine).  Patients are able to view lab/test results, encounter notes, upcoming appointments, etc.  Non-urgent messages can be sent to your provider as well.   To learn more about what you can do with MyChart, go to [email or web address].    Your next appointment:   6 month(s)  Provider:   Dr. Flora Lipps

## 2023-01-26 ENCOUNTER — Other Ambulatory Visit: Payer: Self-pay | Admitting: Medical Genetics

## 2023-01-26 DIAGNOSIS — Z006 Encounter for examination for normal comparison and control in clinical research program: Secondary | ICD-10-CM

## 2023-03-14 ENCOUNTER — Telehealth: Payer: Self-pay | Admitting: Cardiovascular Disease

## 2023-03-14 MED ORDER — METOPROLOL SUCCINATE ER 25 MG PO TB24: 12.5000 mg | ORAL_TABLET | Freq: Two times a day (BID) | ORAL | 3 refills | Status: DC

## 2023-03-14 NOTE — Telephone Encounter (Signed)
Refill sent to [facility] for Metoprolol 12.5 per patient's request. Will forward message to provider concerning patient stopping Jardiacne.

## 2023-03-14 NOTE — Telephone Encounter (Signed)
Pt c/o medication issue:  1. Name of Medication:   losartan (COZAAR) 25 MG tablet    2. How are you currently taking this medication (dosage and times per day)?  Take 12.5 mg by mouth daily.       3. Are you having a reaction (difficulty breathing--STAT)? No  4. What is your medication issue? Pt has been taking half a tablet of this medication per Dr. Shari Prows so it's now being requesting to have a new script sent over with correct dosage and times per day. Since the change he's been getting an over supply of this medication. Pt has also stopped his Jardiance due to cost but Marcelino Duster will discuss further once called back. Please advise

## 2023-03-15 DIAGNOSIS — Z1331 Encounter for screening for depression: Secondary | ICD-10-CM | POA: Diagnosis not present

## 2023-03-15 DIAGNOSIS — R609 Edema, unspecified: Secondary | ICD-10-CM | POA: Diagnosis not present

## 2023-03-15 DIAGNOSIS — Z125 Encounter for screening for malignant neoplasm of prostate: Secondary | ICD-10-CM | POA: Diagnosis not present

## 2023-03-15 DIAGNOSIS — I77810 Thoracic aortic ectasia: Secondary | ICD-10-CM | POA: Diagnosis not present

## 2023-03-15 DIAGNOSIS — N400 Enlarged prostate without lower urinary tract symptoms: Secondary | ICD-10-CM | POA: Diagnosis not present

## 2023-03-15 DIAGNOSIS — E782 Mixed hyperlipidemia: Secondary | ICD-10-CM | POA: Diagnosis not present

## 2023-03-15 DIAGNOSIS — I5042 Chronic combined systolic (congestive) and diastolic (congestive) heart failure: Secondary | ICD-10-CM | POA: Diagnosis not present

## 2023-03-15 DIAGNOSIS — J309 Allergic rhinitis, unspecified: Secondary | ICD-10-CM | POA: Diagnosis not present

## 2023-03-15 DIAGNOSIS — Z0000 Encounter for general adult medical examination without abnormal findings: Secondary | ICD-10-CM | POA: Diagnosis not present

## 2023-03-15 DIAGNOSIS — N529 Male erectile dysfunction, unspecified: Secondary | ICD-10-CM | POA: Diagnosis not present

## 2023-03-15 DIAGNOSIS — M65312 Trigger thumb, left thumb: Secondary | ICD-10-CM | POA: Diagnosis not present

## 2023-03-15 DIAGNOSIS — I2510 Atherosclerotic heart disease of native coronary artery without angina pectoris: Secondary | ICD-10-CM | POA: Diagnosis not present

## 2023-03-15 MED ORDER — METOPROLOL SUCCINATE ER 25 MG PO TB24: 12.5000 mg | ORAL_TABLET | Freq: Every day | ORAL | 3 refills | Status: DC

## 2023-03-15 NOTE — Telephone Encounter (Signed)
Called pharmacy to cancel Metoprolol 12.5 mg twice.

## 2023-04-18 DIAGNOSIS — L814 Other melanin hyperpigmentation: Secondary | ICD-10-CM | POA: Diagnosis not present

## 2023-04-18 DIAGNOSIS — R079 Chest pain, unspecified: Secondary | ICD-10-CM | POA: Diagnosis not present

## 2023-04-18 DIAGNOSIS — D225 Melanocytic nevi of trunk: Secondary | ICD-10-CM | POA: Diagnosis not present

## 2023-04-18 DIAGNOSIS — L988 Other specified disorders of the skin and subcutaneous tissue: Secondary | ICD-10-CM | POA: Diagnosis not present

## 2023-04-18 DIAGNOSIS — R051 Acute cough: Secondary | ICD-10-CM | POA: Diagnosis not present

## 2023-04-18 DIAGNOSIS — L821 Other seborrheic keratosis: Secondary | ICD-10-CM | POA: Diagnosis not present

## 2023-04-18 DIAGNOSIS — L80 Vitiligo: Secondary | ICD-10-CM | POA: Diagnosis not present

## 2023-04-18 DIAGNOSIS — J329 Chronic sinusitis, unspecified: Secondary | ICD-10-CM | POA: Diagnosis not present

## 2023-04-23 DIAGNOSIS — H5212 Myopia, left eye: Secondary | ICD-10-CM | POA: Diagnosis not present

## 2023-05-07 ENCOUNTER — Other Ambulatory Visit: Payer: Self-pay

## 2023-05-07 ENCOUNTER — Telehealth: Payer: Self-pay | Admitting: Cardiovascular Disease

## 2023-05-07 ENCOUNTER — Other Ambulatory Visit: Payer: Self-pay | Admitting: Cardiovascular Disease

## 2023-05-07 MED ORDER — LOSARTAN POTASSIUM 25 MG PO TABS: 12.5000 mg | ORAL_TABLET | Freq: Every day | ORAL | 1 refills | Status: DC

## 2023-05-07 MED ORDER — METOPROLOL SUCCINATE ER 25 MG PO TB24: 12.5000 mg | ORAL_TABLET | Freq: Two times a day (BID) | ORAL | 3 refills | Status: DC

## 2023-05-07 NOTE — Telephone Encounter (Signed)
Called and spoke to patient who is calling about Metoprolol 12.5 mg. Patient stated the prescription he received was is for once daily and he only received 45 tablets. He has been taking it twice daily. Looking at his telephone note it is for twice daily per Dr Flora Lipps. New prescritions sent to [facility] for Metoprolol 12.5 mg twice daily.

## 2023-05-07 NOTE — Telephone Encounter (Signed)
Pt c/o medication issue:  1. Name of Medication:   metoprolol succinate (TOPROL-XL) 25 MG 24 hr tablet    2. How are you currently taking this medication (dosage and times per day)?   Take 0.5 tablets (12.5 mg total) by mouth daily.    3. Are you having a reaction (difficulty breathing--STAT)? No  4. What is your medication issue? Pt states he has been taking 0.5 tablets twice a day to equal one pill. Pt states the medication was only called in for him to be taking once a day. Please advise

## 2023-05-24 ENCOUNTER — Other Ambulatory Visit: Payer: Self-pay | Admitting: Physician Assistant

## 2023-06-13 ENCOUNTER — Other Ambulatory Visit: Payer: Self-pay

## 2023-06-13 DIAGNOSIS — E782 Mixed hyperlipidemia: Secondary | ICD-10-CM

## 2023-06-13 DIAGNOSIS — I25118 Atherosclerotic heart disease of native coronary artery with other forms of angina pectoris: Secondary | ICD-10-CM

## 2023-06-13 DIAGNOSIS — Z79899 Other long term (current) drug therapy: Secondary | ICD-10-CM

## 2023-06-13 DIAGNOSIS — I2510 Atherosclerotic heart disease of native coronary artery without angina pectoris: Secondary | ICD-10-CM

## 2023-06-13 MED ORDER — ROSUVASTATIN CALCIUM 20 MG PO TABS
20.0000 mg | ORAL_TABLET | Freq: Every day | ORAL | 1 refills | Status: DC
Start: 2023-06-13 — End: 2023-12-14

## 2023-06-13 MED ORDER — EZETIMIBE 10 MG PO TABS
10.0000 mg | ORAL_TABLET | Freq: Every day | ORAL | 1 refills | Status: DC
Start: 2023-06-13 — End: 2023-12-14
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
## 2023-06-18 ENCOUNTER — Encounter: Payer: Self-pay | Admitting: Cardiovascular Disease

## 2023-06-18 ENCOUNTER — Ambulatory Visit: Payer: Medicare HMO | Attending: Cardiovascular Disease | Admitting: Cardiovascular Disease

## 2023-06-18 VITALS — BP 128/72 | HR 71 | Ht 69.5 in | Wt 229.0 lb

## 2023-06-18 DIAGNOSIS — I2510 Atherosclerotic heart disease of native coronary artery without angina pectoris: Secondary | ICD-10-CM

## 2023-06-18 DIAGNOSIS — R0602 Shortness of breath: Secondary | ICD-10-CM

## 2023-06-18 DIAGNOSIS — I447 Left bundle-branch block, unspecified: Secondary | ICD-10-CM

## 2023-06-18 DIAGNOSIS — I5042 Chronic combined systolic (congestive) and diastolic (congestive) heart failure: Secondary | ICD-10-CM | POA: Diagnosis not present

## 2023-06-18 DIAGNOSIS — E782 Mixed hyperlipidemia: Secondary | ICD-10-CM | POA: Diagnosis not present

## 2023-06-18 LAB — BASIC METABOLIC PANEL
BUN/Creatinine Ratio: 17 (ref 10–24)
BUN: 17 mg/dL (ref 8–27)
CO2: 24 mmol/L (ref 20–29)
Calcium: 9.6 mg/dL (ref 8.6–10.2)
Chloride: 100 mmol/L (ref 96–106)
Creatinine, Ser: 0.99 mg/dL (ref 0.76–1.27)
Glucose: 79 mg/dL (ref 70–99)
Potassium: 4.2 mmol/L (ref 3.5–5.2)
Sodium: 139 mmol/L (ref 134–144)
eGFR: 82 mL/min/{1.73_m2} (ref >59)

## 2023-06-18 NOTE — Patient Instructions (Addendum)
Medication Instructions:   Your physician recommends that you continue on your current medications as directed. Please refer to the Current Medication list given to you today.   *If you need a refill on your cardiac medications before your next appointment, please call your pharmacy*   Lab Work: BMET    If you have labs (blood work) drawn today and your tests are completely normal, you will receive your results only by: MyChart Message (if you have MyChart) OR A paper copy in the mail If you have any lab test that is abnormal or we need to change your treatment, we will call you to review the results.   Testing/Procedures: Echo will be scheduled at [address].  Your physician has requested that you have an echocardiogram. Echocardiography is a painless test that uses sound waves to create images of your heart. It provides your doctor with information about the size and shape of your heart and how well your heart's chambers and valves are working. This procedure takes approximately one hour. There are no restrictions for this procedure. Please do NOT wear cologne, perfume, aftershave, or lotions (deodorant is allowed). Please arrive 15 minutes prior to your appointment time.    Follow-Up: At [facility], you and your health needs are our priority.  As part of our continuing mission to provide you with exceptional heart care, we have created designated Provider Care Teams.  These Care Teams include your primary Cardiologist (physician) and Advanced Practice Providers (APPs -  Physician Assistants and Nurse Practitioners) who all work together to provide you with the care you need, when you need it.  We recommend signing up for the patient portal called "MyChart".  Sign up information is provided on this After Visit Summary.  MyChart is used to connect with patients for Virtual Visits (Telemedicine).  Patients are able to view lab/test results, encounter notes, upcoming  appointments, etc.  Non-urgent messages can be sent to your provider as well.   To learn more about what you can do with MyChart, go to [email or web address].    Your next appointment:   3 month(s)  The format for your next appointment:   In Person  Provider:   Reatha Harps, MD    Other Instructions

## 2023-06-19 ENCOUNTER — Other Ambulatory Visit: Payer: Self-pay | Admitting: Physician Assistant

## 2023-06-19 ENCOUNTER — Encounter: Payer: Self-pay | Admitting: Cardiovascular Disease

## 2023-06-20 ENCOUNTER — Encounter: Payer: Self-pay | Admitting: Cardiovascular Disease

## 2023-06-20 DIAGNOSIS — N401 Enlarged prostate with lower urinary tract symptoms: Secondary | ICD-10-CM | POA: Diagnosis not present

## 2023-06-20 DIAGNOSIS — R3912 Poor urinary stream: Secondary | ICD-10-CM | POA: Diagnosis not present

## 2023-06-20 MED ORDER — EMPAGLIFLOZIN 10 MG PO TABS: 10.0000 mg | ORAL_TABLET | Freq: Every day | ORAL | 3 refills | Status: DC

## 2023-06-20 NOTE — Telephone Encounter (Signed)
Error

## 2023-06-21 ENCOUNTER — Other Ambulatory Visit: Payer: Self-pay | Admitting: Cardiovascular Disease

## 2023-06-27 DIAGNOSIS — L720 Epidermal cyst: Secondary | ICD-10-CM | POA: Diagnosis not present

## 2023-06-27 DIAGNOSIS — D492 Neoplasm of unspecified behavior of bone, soft tissue, and skin: Secondary | ICD-10-CM | POA: Diagnosis not present

## 2023-07-12 ENCOUNTER — Other Ambulatory Visit: Payer: Self-pay

## 2023-07-12 MED ORDER — FUROSEMIDE 20 MG PO TABS: 20.0000 mg | ORAL_TABLET | ORAL | 11 refills | Status: AC | PRN

## 2023-07-18 ENCOUNTER — Encounter: Payer: Self-pay | Admitting: Cardiovascular Disease

## 2023-07-18 ENCOUNTER — Ambulatory Visit (HOSPITAL_COMMUNITY)
Admission: RE | Admit: 2023-07-18 | Discharge: 2023-07-18 | Disposition: A | Discharge status: Home / Self Care | Payer: Medicare HMO | Source: Ambulatory Visit | Attending: Cardiovascular Disease | Admitting: Cardiovascular Disease

## 2023-07-18 DIAGNOSIS — I5042 Chronic combined systolic (congestive) and diastolic (congestive) heart failure: Secondary | ICD-10-CM | POA: Insufficient documentation

## 2023-07-18 LAB — ECHOCARDIOGRAM COMPLETE
AR max vel: 2.47 cm2
AV Area VTI: 2.49 cm2
AV Area mean vel: 2.49 cm2
AV Mean grad: 7 mm[Hg]
AV Peak grad: 12.5 mm[Hg]
Ao pk vel: 1.77 m/s
Area-P 1/2: 5.75 cm2
MV M vel: 1.97 m/s
MV Peak grad: 15.5 mm[Hg]
S' Lateral: 3.55 cm

## 2023-08-02 DIAGNOSIS — S0501XA Injury of conjunctiva and corneal abrasion without foreign body, right eye, initial encounter: Secondary | ICD-10-CM | POA: Diagnosis not present

## 2023-08-06 DIAGNOSIS — S0501XA Injury of conjunctiva and corneal abrasion without foreign body, right eye, initial encounter: Secondary | ICD-10-CM | POA: Diagnosis not present

## 2023-09-09 NOTE — Progress Notes (Unsigned)
 Cardiology Office Note:  .   Date:  09/10/2023  ID:  BELA NYBORG, DOB 1950/10/27, MRN [id number] PCP: Rae Bugler, MD  [facility] Providers Cardiologist:  Oneil Bigness, MD { History of Present Illness: .    Chief Complaint  Patient presents with   Follow-up         Glenn Murray is a 73 y.o. male with history of CHF, CAD, LBBB who presents for follow-up. EF has improved 50-55%.    History of Present Illness   DEANGLEO PASSAGE is a 73 year old male with systolic heart failure, left bundle branch block, and coronary artery disease who presents for follow-up.  He has a history of systolic heart failure with a previously reduced ejection fraction that has now improved to 50-55%. He attributes his improvement to regular exercise, including swimming, spin classes, and weightlifting, and has resumed working out three to four times a week after a period of reduced activity due to COVID-19.  He experiences no chest pain or dyspnea but mentions ongoing sinus-related breathing issues, which he attributes to allergies. He has undergone multiple sinus surgeries in the past.  His current medications include aspirin  81 mg daily, Crestor  20 mg daily, losartan  12.5 mg daily, metoprolol  succinate 12.5 mg daily, and Jardiance  10 mg daily. He takes furosemide  as needed but has been using it daily to manage weight gain. He monitors his blood pressure regularly, noting readings around 110/59 mmHg.  He has a history of hyperlipidemia, which is well-controlled with his current medication regimen. He also has a chronic total occlusion of the RCA but reports no symptoms of angina.          Problem List Systolic HF -mixed CAD/LBBB -EF 40-45% 07/2020 -EF 50-55% 07/18/2023 CAD -CTO RCA 07/07/2020 3. LBBB -QRS 180 ms 4. HLD -T chol 133, HDL 44, LDL 57, TG 190    ROS: All other ROS reviewed and negative. Pertinent positives noted in the HPI.     Studies Reviewed: Aaron Aas       TTE 07/18/2023   1. Left ventricular ejection fraction, by estimation, is 50 to 55%. Left  ventricular ejection fraction by 3D volume is 54 %. The left ventricle has  low normal function. The left ventricle has no regional wall motion  abnormalities. Left ventricular  diastolic parameters are consistent with Grade I diastolic dysfunction  (impaired relaxation). Elevated left ventricular end-diastolic pressure.   2. Right ventricular systolic function is normal. The right ventricular  size is normal. There is normal pulmonary artery systolic pressure. The  estimated right ventricular systolic pressure is 20.0 mmHg.   3. The mitral valve is abnormal. Trivial mitral valve regurgitation.   4. The aortic valve is tricuspid. Aortic valve regurgitation is not  visualized. Aortic valve sclerosis is present, with no evidence of aortic  valve stenosis.   5. The inferior vena cava is normal in size with <50% respiratory  variability, suggesting right atrial pressure of 8 mmHg.  Physical Exam:   VS:  BP 121/79 (BP Location: Right Arm, Patient Position: Sitting, Cuff Size: Normal)   Pulse 65   Wt 227 lb 6.4 oz (103.1 kg)   SpO2 93%   BMI 33.10 kg/m    Wt Readings from Last 3 Encounters:  09/10/23 227 lb 6.4 oz (103.1 kg)  06/18/23 229 lb (103.9 kg)  12/20/22 228 lb (103.4 kg)    GEN: Well nourished, well developed in no acute distress NECK: No JVD; No carotid  bruits CARDIAC: RRR, no murmurs, rubs, gallops RESPIRATORY:  Clear to auscultation without rales, wheezing or rhonchi  ABDOMEN: Soft, non-tender, non-distended EXTREMITIES:  No edema; No deformity  ASSESSMENT AND PLAN: .   Assessment and Plan    Systolic heart failure with recovered ejection fraction Systolic heart failure with mixed ischemic etiology and left bundle branch block. Ejection fraction improved to 50-55%. No symptoms of chest pain, dyspnea, or swelling. Current medications effective. - Continue losartan  12.5 mg daily. - Continue metoprolol   succinate 12.5 mg daily. - Continue Jardiance  10 mg daily. - Adjust furosemide  as needed.  Left bundle branch block Left bundle branch block with QRS duration of 180 ms. Improvement noted with medical therapy. Cardiac resynchronization therapy not needed. - Continue current medical therapy.  Coronary artery disease with chronic total occlusion of the RCA Coronary artery disease with chronic total occlusion of the right coronary artery. Asymptomatic with no angina. LDL cholesterol at goal. Lipids well controlled. - Continue aspirin  81 mg daily. - Continue Crestor  20 mg daily. - Continue Zetia  10 mg daily.              Follow-up: Return in about 1 year (around 09/09/2024).   Signed, Gigi Kyle. Rolm Clos, MD, [facility]  [address], [address] [place], [place] [address] [phone]  9:00 AM

## 2023-09-10 ENCOUNTER — Encounter: Payer: Self-pay | Admitting: Cardiovascular Disease

## 2023-09-10 ENCOUNTER — Ambulatory Visit: Payer: Medicare HMO | Attending: Cardiovascular Disease | Admitting: Cardiovascular Disease

## 2023-09-10 VITALS — BP 121/79 | HR 65 | Wt 227.4 lb

## 2023-09-10 DIAGNOSIS — I447 Left bundle-branch block, unspecified: Secondary | ICD-10-CM

## 2023-09-10 DIAGNOSIS — I2510 Atherosclerotic heart disease of native coronary artery without angina pectoris: Secondary | ICD-10-CM | POA: Diagnosis not present

## 2023-09-10 DIAGNOSIS — E782 Mixed hyperlipidemia: Secondary | ICD-10-CM | POA: Diagnosis not present

## 2023-09-10 DIAGNOSIS — I5042 Chronic combined systolic (congestive) and diastolic (congestive) heart failure: Secondary | ICD-10-CM | POA: Diagnosis not present

## 2023-09-10 NOTE — Patient Instructions (Signed)
 Medication Instructions:   Your physician recommends that you continue on your current medications as directed. Please refer to the Current Medication list given to you today.   *If you need a refill on your cardiac medications before your next appointment, please call your pharmacy*   Lab Work: None    If you have labs (blood work) drawn today and your tests are completely normal, you will receive your results only by: MyChart Message (if you have MyChart) OR A paper copy in the mail If you have any lab test that is abnormal or we need to change your treatment, we will call you to review the results.   Testing/Procedures: None    Follow-Up: At [facility], you and your health needs are our priority.  As part of our continuing mission to provide you with exceptional heart care, we have created designated Provider Care Teams.  These Care Teams include your primary Cardiologist (physician) and Advanced Practice Providers (APPs -  Physician Assistants and Nurse Practitioners) who all work together to provide you with the care you need, when you need it.  We recommend signing up for the patient portal called "MyChart".  Sign up information is provided on this After Visit Summary.  MyChart is used to connect with patients for Virtual Visits (Telemedicine).  Patients are able to view lab/test results, encounter notes, upcoming appointments, etc.  Non-urgent messages can be sent to your provider as well.   To learn more about what you can do with MyChart, go to [email or web address].    Your next appointment:   1 year(s)  The format for your next appointment:   In Person  Provider:   Edd Fabian, FNP, Micah Flesher, PA-C, Marjie Skiff, PA-C, Robet Leu, PA-C, Azalee Course, PA-C, Bernadene Person, NP, or Reather Littler, NP     Other Instructions

## 2023-12-05 IMAGING — CT CT ANGIO CHEST
3 of 7 series · 18 of 46 positions shown · IV contrast (APPLIED)
Comparison: None Available.

CLINICAL DATA: Aortic dilatation.

EXAM:
CT ANGIOGRAPHY CHEST WITH CONTRAST
TECHNIQUE: Multidetector CT imaging of the chest was performed using the
standard protocol during bolus administration of intravenous
contrast. Multiplanar CT image reconstructions and MIPs were
obtained to evaluate the vascular anatomy.

[Series 5: thins · axial · 0.86mm/px · z∈[-345,-30]mm · 15 of 361 slices shown]
[im 23/361  lung]
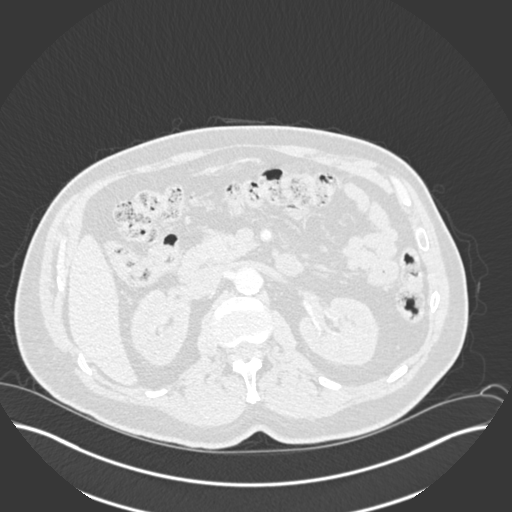
[im 46/361  soft-tissue]
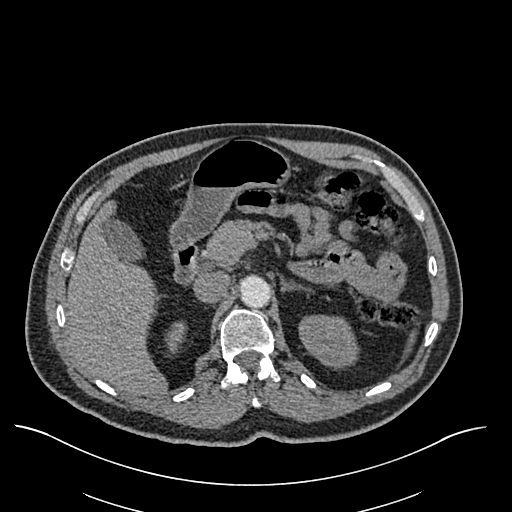
[im 68/361  lung]
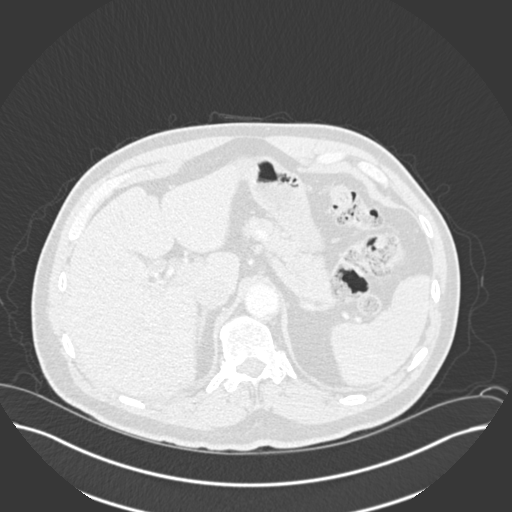
[im 91/361  soft-tissue]
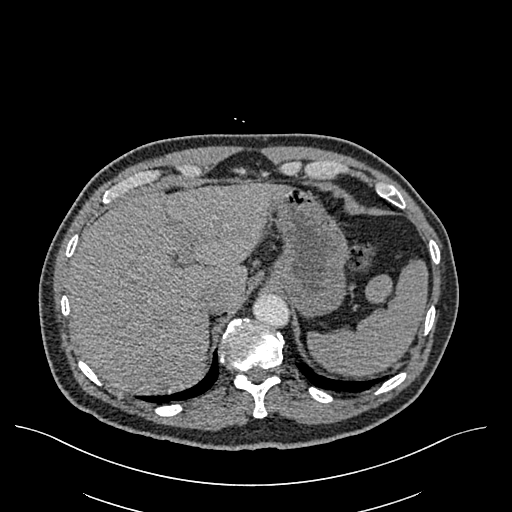
[im 113/361  lung]
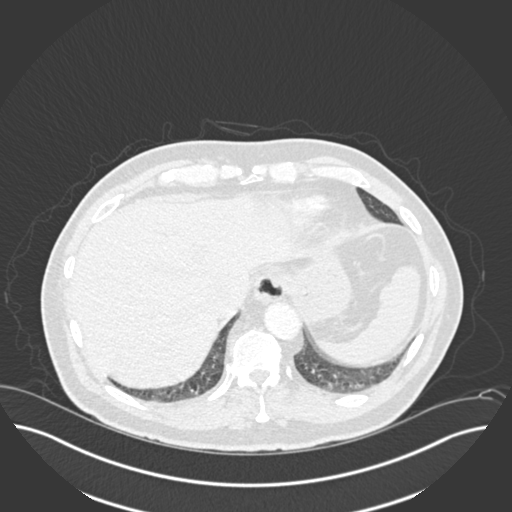
[im 136/361  soft-tissue]
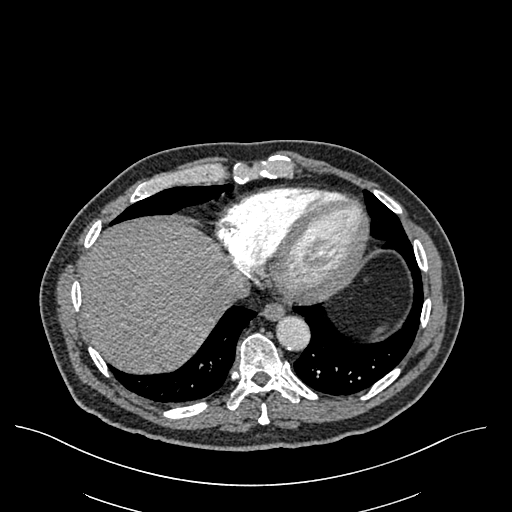
[im 158/361  lung]
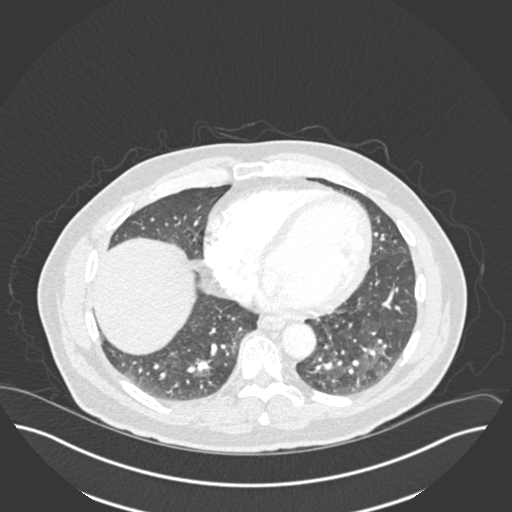
[im 181/361  soft-tissue]
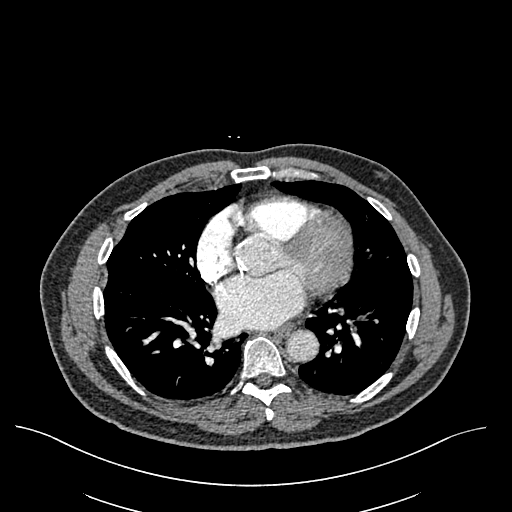
[im 203/361  lung]
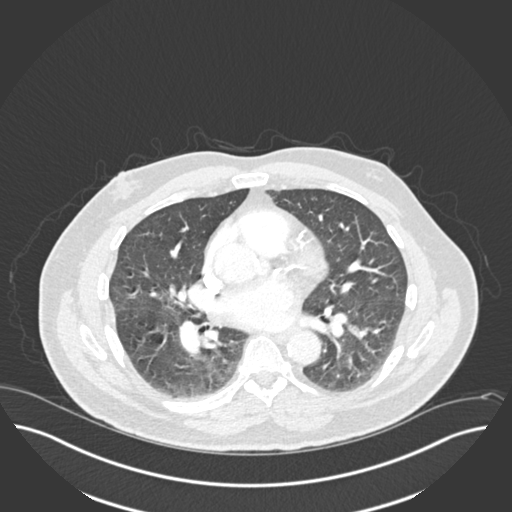
[im 226/361  soft-tissue]
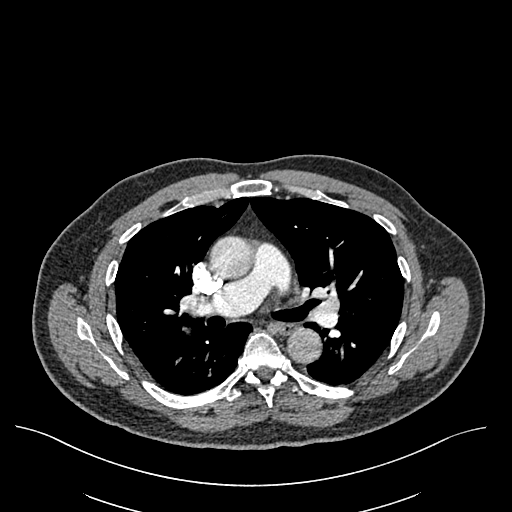
[im 248/361  lung]
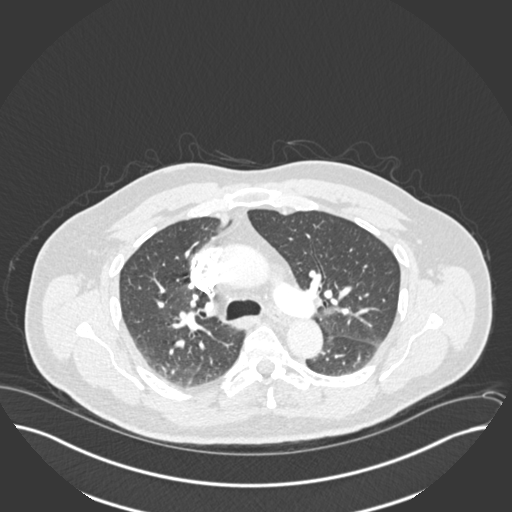
[im 271/361  soft-tissue]
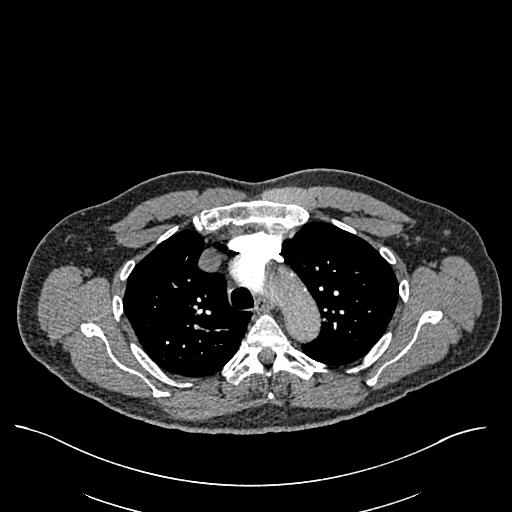
[im 293/361  lung]
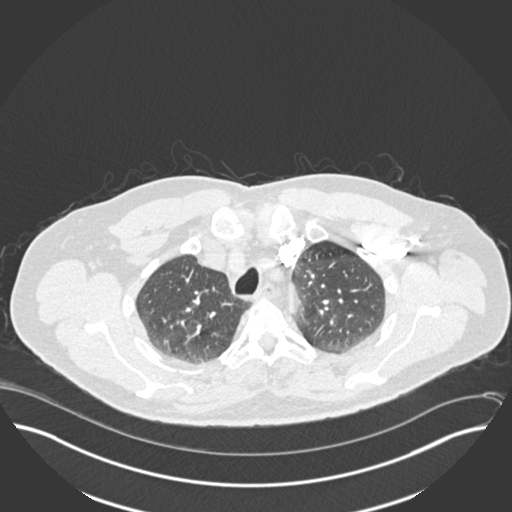
[im 316/361  soft-tissue]
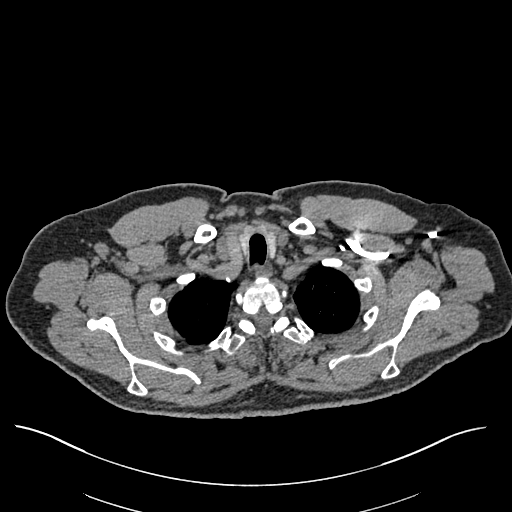
[im 338/361  lung]
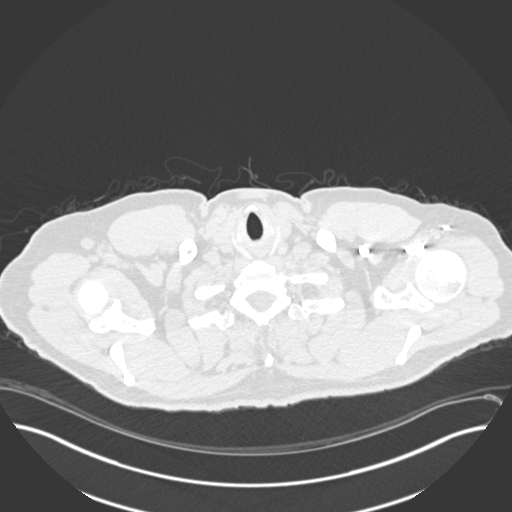

[Series 6: coronal mpr · coronal · 0.73mm/px · 2 of 96 slices shown]
[im 32/96  soft-tissue]
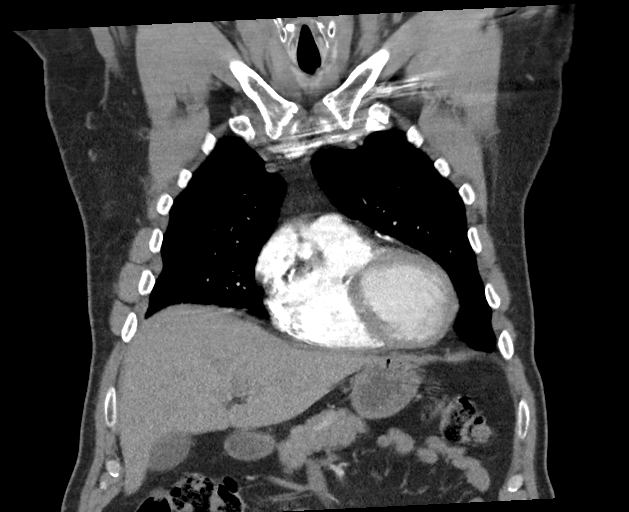
[im 64/96  soft-tissue]
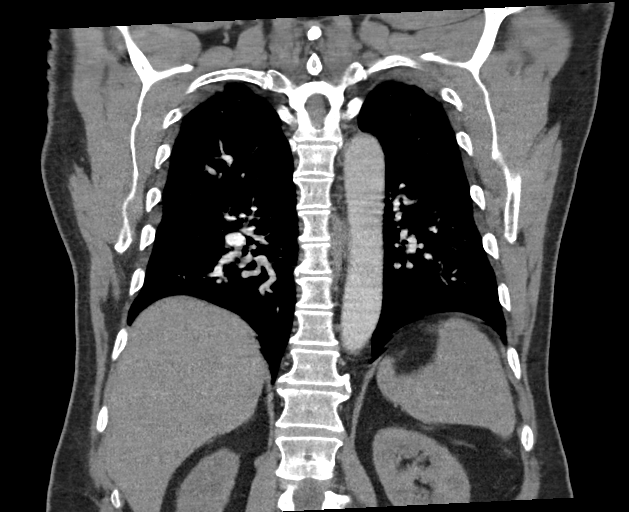

[Series 10: lung · axial · 0.86mm/px · 1 of 181 slices shown]
[im 23/181  soft-tissue]
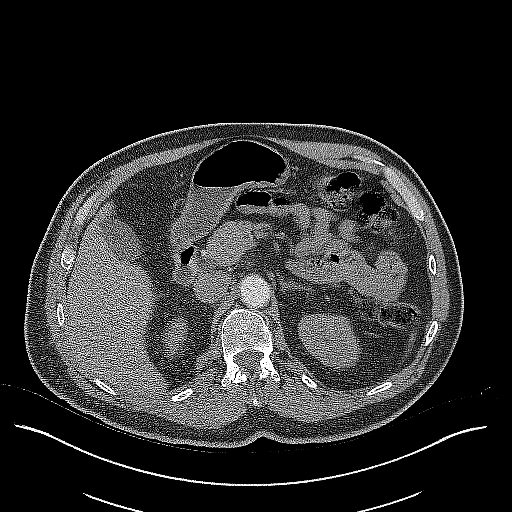

[18 of 46 positions shown; findings below may reference images not displayed]

RADIATION DOSE REDUCTION: This exam was performed according to the
departmental dose-optimization program which includes automated
exposure control, adjustment of the mA and/or kV according to
patient size and/or use of iterative reconstruction technique.

CONTRAST:  80mL OMNIPAQUE IOHEXOL 350 MG/ML SOLN
FINDINGS: Cardiovascular: Atherosclerosis of thoracic aorta is noted without
aneurysm. Ascending aorta has maximum measured diameter of 3.7 cm.
Normal cardiac size. No pericardial effusion. Coronary artery
calcifications are noted.

Mediastinum/Nodes: No enlarged mediastinal, hilar, or axillary lymph
nodes. Thyroid gland, trachea, and esophagus demonstrate no
significant findings.

Lungs/Pleura: Lungs are clear. No pleural effusion or pneumothorax.

Upper Abdomen: No acute abnormality.

Musculoskeletal: No chest wall abnormality. No acute or significant
osseous findings.

Review of the MIP images confirms the above findings.
IMPRESSION: No evidence of thoracic aortic aneurysm.

Coronary artery calcifications are noted suggesting coronary artery
disease.

Aortic Atherosclerosis ([id number]).

## 2023-12-12 ENCOUNTER — Other Ambulatory Visit (HOSPITAL_COMMUNITY)
Admission: RE | Admit: 2023-12-12 | Discharge: 2023-12-12 | Disposition: A | Discharge status: Home / Self Care | Payer: Self-pay | Source: Ambulatory Visit | Attending: Medical Genetics | Admitting: Medical Genetics

## 2023-12-12 DIAGNOSIS — Z006 Encounter for examination for normal comparison and control in clinical research program: Secondary | ICD-10-CM | POA: Insufficient documentation

## 2023-12-13 ENCOUNTER — Other Ambulatory Visit: Payer: Self-pay | Admitting: Cardiovascular Disease

## 2023-12-13 DIAGNOSIS — E782 Mixed hyperlipidemia: Secondary | ICD-10-CM

## 2023-12-13 DIAGNOSIS — I25118 Atherosclerotic heart disease of native coronary artery with other forms of angina pectoris: Secondary | ICD-10-CM

## 2023-12-13 DIAGNOSIS — Z79899 Other long term (current) drug therapy: Secondary | ICD-10-CM

## 2023-12-13 DIAGNOSIS — I2510 Atherosclerotic heart disease of native coronary artery without angina pectoris: Secondary | ICD-10-CM

## 2023-12-21 LAB — GENECONNECT MOLECULAR SCREEN: Genetic Analysis Overall Interpretation: NEGATIVE

## 2024-01-11 DIAGNOSIS — U071 COVID-19: Secondary | ICD-10-CM | POA: Diagnosis not present

## 2024-01-22 DIAGNOSIS — M25562 Pain in left knee: Secondary | ICD-10-CM | POA: Diagnosis not present

## 2024-01-22 DIAGNOSIS — M5459 Other low back pain: Secondary | ICD-10-CM | POA: Diagnosis not present

## 2024-02-28 DIAGNOSIS — F410 Panic disorder [episodic paroxysmal anxiety] without agoraphobia: Secondary | ICD-10-CM | POA: Diagnosis not present

## 2024-03-11 DIAGNOSIS — M545 Low back pain, unspecified: Secondary | ICD-10-CM | POA: Diagnosis not present

## 2024-03-14 ENCOUNTER — Telehealth: Payer: Self-pay | Admitting: Cardiovascular Disease

## 2024-03-14 DIAGNOSIS — M545 Low back pain, unspecified: Secondary | ICD-10-CM | POA: Diagnosis not present

## 2024-03-14 MED ORDER — EMPAGLIFLOZIN 10 MG PO TABS: 10.0000 mg | ORAL_TABLET | Freq: Every day | ORAL | 1 refills | Status: DC

## 2024-03-14 NOTE — Telephone Encounter (Signed)
*  STAT* If patient is at the pharmacy, call can be transferred to refill team.   1. Which medications need to be refilled? (please list name of each medication and dose if known)   empagliflozin  (JARDIANCE ) 10 MG TABS tablet   2. Which pharmacy/location (including street and city if local pharmacy) is medication to be sent to? [facility] [id number] - [place], [place] - [address] Phone: [phone]  Fax: [phone]     3. Do they need a 30 day or 90 day supply? 90

## 2024-03-14 NOTE — Telephone Encounter (Signed)
 RX sent in

## 2024-03-18 DIAGNOSIS — M545 Low back pain, unspecified: Secondary | ICD-10-CM | POA: Diagnosis not present

## 2024-03-21 DIAGNOSIS — M545 Low back pain, unspecified: Secondary | ICD-10-CM | POA: Diagnosis not present

## 2024-04-01 DIAGNOSIS — M545 Low back pain, unspecified: Secondary | ICD-10-CM | POA: Diagnosis not present

## 2024-04-21 DIAGNOSIS — D1801 Hemangioma of skin and subcutaneous tissue: Secondary | ICD-10-CM | POA: Diagnosis not present

## 2024-04-21 DIAGNOSIS — L720 Epidermal cyst: Secondary | ICD-10-CM | POA: Diagnosis not present

## 2024-04-21 DIAGNOSIS — L814 Other melanin hyperpigmentation: Secondary | ICD-10-CM | POA: Diagnosis not present

## 2024-04-21 DIAGNOSIS — L821 Other seborrheic keratosis: Secondary | ICD-10-CM | POA: Diagnosis not present

## 2024-04-21 DIAGNOSIS — Z872 Personal history of diseases of the skin and subcutaneous tissue: Secondary | ICD-10-CM | POA: Diagnosis not present

## 2024-05-01 DIAGNOSIS — E782 Mixed hyperlipidemia: Secondary | ICD-10-CM | POA: Diagnosis not present

## 2024-05-01 DIAGNOSIS — Z125 Encounter for screening for malignant neoplasm of prostate: Secondary | ICD-10-CM | POA: Diagnosis not present

## 2024-05-02 ENCOUNTER — Other Ambulatory Visit: Payer: Self-pay | Admitting: Cardiovascular Disease

## 2024-06-10 ENCOUNTER — Other Ambulatory Visit: Payer: Self-pay | Admitting: Cardiovascular Disease
